# Patient Record
Sex: Female | Born: 1980
Health system: Southern US, Community
[De-identification: ages and names within clinical notes are randomized; demographics above are authoritative.]

## PROBLEM LIST (undated history)

## (undated) DIAGNOSIS — R112 Nausea with vomiting, unspecified: Secondary | ICD-10-CM

## (undated) DIAGNOSIS — Z9889 Other specified postprocedural states: Secondary | ICD-10-CM

## (undated) DIAGNOSIS — K219 Gastro-esophageal reflux disease without esophagitis: Secondary | ICD-10-CM

## (undated) DIAGNOSIS — H9190 Unspecified hearing loss, unspecified ear: Secondary | ICD-10-CM

## (undated) DIAGNOSIS — Z3480 Encounter for supervision of other normal pregnancy, unspecified trimester: Secondary | ICD-10-CM

## (undated) DIAGNOSIS — O99619 Diseases of the digestive system complicating pregnancy, unspecified trimester: Secondary | ICD-10-CM

## (undated) DIAGNOSIS — Z975 Presence of (intrauterine) contraceptive device: Secondary | ICD-10-CM

## (undated) DIAGNOSIS — Z98891 History of uterine scar from previous surgery: Secondary | ICD-10-CM

## (undated) HISTORY — DX: Presence of (intrauterine) contraceptive device: Z97.5

## (undated) HISTORY — PX: WISDOM TOOTH EXTRACTION: SHX21

---

## 2004-11-29 ENCOUNTER — Emergency Department: Payer: Self-pay | Admitting: Emergency Medicine

## 2008-01-19 ENCOUNTER — Emergency Department (HOSPITAL_COMMUNITY): Admission: EM | Admit: 2008-01-19 | Discharge: 2008-01-19 | Payer: Self-pay | Admitting: Emergency Medicine

## 2008-02-26 ENCOUNTER — Emergency Department: Payer: Self-pay | Admitting: Emergency Medicine

## 2008-02-26 ENCOUNTER — Other Ambulatory Visit: Payer: Self-pay

## 2008-02-26 ENCOUNTER — Ambulatory Visit: Payer: Self-pay

## 2008-02-27 ENCOUNTER — Inpatient Hospital Stay: Payer: Self-pay

## 2008-03-02 ENCOUNTER — Emergency Department: Payer: Self-pay | Admitting: Emergency Medicine

## 2009-05-09 ENCOUNTER — Emergency Department (HOSPITAL_COMMUNITY): Admission: EM | Admit: 2009-05-09 | Discharge: 2009-05-09 | Payer: Self-pay | Admitting: Emergency Medicine

## 2009-07-22 ENCOUNTER — Emergency Department (HOSPITAL_COMMUNITY): Admission: EM | Admit: 2009-07-22 | Discharge: 2009-07-22 | Payer: Self-pay | Admitting: Emergency Medicine

## 2012-03-22 ENCOUNTER — Inpatient Hospital Stay (HOSPITAL_COMMUNITY)
Admission: AD | Admit: 2012-03-22 | Discharge: 2012-03-22 | Disposition: A | Discharge status: Home / Self Care | Payer: Medicaid Other | Source: Ambulatory Visit | Attending: Obstetrics & Gynecology | Admitting: Obstetrics & Gynecology

## 2012-03-22 ENCOUNTER — Encounter (HOSPITAL_COMMUNITY): Payer: Self-pay

## 2012-03-22 DIAGNOSIS — Z3202 Encounter for pregnancy test, result negative: Secondary | ICD-10-CM

## 2012-03-22 HISTORY — DX: Unspecified hearing loss, unspecified ear: H91.90

## 2012-03-22 NOTE — MAU Note (Signed)
Deaf Interpreter line called back requested information will call us back when they know how long it will be before the deaf interpreter will be able to come.

## 2012-03-22 NOTE — MAU Note (Signed)
Via a sign Presenter, broadcasting, patient states that she is here to confirm if she is pregnant. She states that she is having pregnancy symptoms (nausea, sore breast). Her lmp 03/01/2012. She states that she is going to see her physician on Monday for a physical exam and plan to get an injection that she is not sure the name or its purpose. Presently, she denies any pain, vaginal bleeding or abnormal discharge. She states that she wants to concieve and had questions on when its time to get pregnant.

## 2012-03-22 NOTE — Progress Notes (Signed)
Interpreter will be here in about and pt aware

## 2012-03-22 NOTE — MAU Provider Note (Signed)
  History     CSN: [id number]  Arrival date and time: 03/22/12 1935   None   [name] y.o. No obstetric history on file.  Chief Complaint  Patient presents with  . Possible Pregnancy   HPI She was here today for a pregnancy test. She wants to become pregnant. She is deaf, and there seems to be some communication errors between her and the admissions desk; who thought she was here for a depo shot. She has no complaints and does not want to be examined in any form. This was communicated through the sign language translator.    Past Medical History  Diagnosis Date  . Deafness     Past Surgical History  Procedure Date  . Cesarean section     History reviewed. No pertinent family history.  History  Substance Use Topics  . Smoking status: Never Smoker   . Smokeless tobacco: Not on file  . Alcohol Use: No    Allergies: No Known Allergies  No prescriptions prior to admission    Review of Systems  All other systems reviewed and are negative.    Physical Exam   Blood pressure 119/76, pulse 72, temperature 98.2 F (36.8 C), temperature source Oral, resp. rate 20, height 4\' 11"  (1.499 m), weight 68.402 kg (150 lb 12.8 oz), last menstrual period 03/01/2012.  Physical Exam  Constitutional: She appears well-developed and well-nourished. No distress.       Patient is deaf. Does not desire a formal exam. Happy and pleasant. Apologizing for miscommunication.   Skin: She is not diaphoretic.  Psychiatric: She has a normal mood and affect.    MAU Course  Procedures 1. Pregnancy test  Assessment and Plan  1. Negative Pregnancy test 2. Discussion with translator about menstrual cycles, her particular cycle, ovulation, best time to have sexual intercourse to become pregnant, vitamins and AVS printed out on the same.  3. Advised to make pre-pregnancy appointment with OB/GYN. 4. Discharge home.  Felix Pacini 03/22/2012, 10:13 PM

## 2012-03-23 NOTE — MAU Provider Note (Signed)
Seen and agree [name] CNM 

## 2012-07-23 ENCOUNTER — Encounter: Payer: Self-pay | Admitting: Obstetrics & Gynecology

## 2012-07-23 ENCOUNTER — Ambulatory Visit (INDEPENDENT_AMBULATORY_CARE_PROVIDER_SITE_OTHER): Payer: Medicaid Other | Admitting: Obstetrics & Gynecology

## 2012-07-23 ENCOUNTER — Other Ambulatory Visit (HOSPITAL_COMMUNITY)
Admission: RE | Admit: 2012-07-23 | Discharge: 2012-07-23 | Disposition: A | Discharge status: Home / Self Care | Payer: Medicaid Other | Source: Ambulatory Visit | Attending: Obstetrics & Gynecology | Admitting: Obstetrics & Gynecology

## 2012-07-23 VITALS — BP 106/68 | Temp 98.3°F | Wt 162.2 lb

## 2012-07-23 DIAGNOSIS — Z3480 Encounter for supervision of other normal pregnancy, unspecified trimester: Secondary | ICD-10-CM

## 2012-07-23 DIAGNOSIS — Z3490 Encounter for supervision of normal pregnancy, unspecified, unspecified trimester: Secondary | ICD-10-CM

## 2012-07-23 DIAGNOSIS — Z01419 Encounter for gynecological examination (general) (routine) without abnormal findings: Secondary | ICD-10-CM | POA: Insufficient documentation

## 2012-07-23 DIAGNOSIS — Z113 Encounter for screening for infections with a predominantly sexual mode of transmission: Secondary | ICD-10-CM | POA: Insufficient documentation

## 2012-07-23 LAB — POCT URINALYSIS DIP (DEVICE)
Bilirubin Urine: NEGATIVE
Glucose, UA: NEGATIVE mg/dL
Leukocytes, UA: NEGATIVE
Protein, ur: NEGATIVE mg/dL

## 2012-07-23 NOTE — Patient Instructions (Signed)
Pregnancy - Second Trimester The second trimester of pregnancy (3 to 6 months) is a period of rapid growth for you and your baby. At the end of the sixth month, your baby is about 9 inches long and weighs 1 1/2 pounds. You will begin to feel the baby move between 18 and 20 weeks of the pregnancy. This is called quickening. Weight gain is faster. A clear fluid (colostrum) may leak out of your breasts. You may feel small contractions of the womb (uterus). This is known as false labor or Braxton-Hicks contractions. This is like a practice for labor when the baby is ready to be born. Usually, the problems with morning sickness have usually passed by the end of your first trimester. Some women develop small dark blotches (called cholasma, mask of pregnancy) on their face that usually goes away after the baby is born. Exposure to the sun makes the blotches worse. Acne may also develop in some pregnant women and pregnant women who have acne, may find that it goes away. PRENATAL EXAMS  Blood work may continue to be done during prenatal exams. These tests are done to check on your health and the probable health of your baby. Blood work is used to follow your blood levels (hemoglobin). Anemia (low hemoglobin) is common during pregnancy. Iron and vitamins are given to help prevent this. You will also be checked for diabetes between 24 and 28 weeks of the pregnancy. Some of the previous blood tests may be repeated.  The size of the uterus is measured during each visit. This is to make sure that the baby is continuing to grow properly according to the dates of the pregnancy.  Your blood pressure is checked every prenatal visit. This is to make sure you are not getting toxemia.  Your urine is checked to make sure you do not have an infection, diabetes or protein in the urine.  Your weight is checked often to make sure gains are happening at the suggested rate. This is to ensure that both you and your baby are growing  normally.  Sometimes, an ultrasound is performed to confirm the proper growth and development of the baby. This is a test which bounces harmless sound waves off the baby so your caregiver can more accurately determine due dates. Sometimes, a specialized test is done on the amniotic fluid surrounding the baby. This test is called an amniocentesis. The amniotic fluid is obtained by sticking a needle into the belly (abdomen). This is done to check the chromosomes in instances where there is a concern about possible genetic problems with the baby. It is also sometimes done near the end of pregnancy if an early delivery is required. In this case, it is done to help make sure the baby's lungs are mature enough for the baby to live outside of the womb. CHANGES OCCURING IN THE SECOND TRIMESTER OF PREGNANCY Your body goes through many changes during pregnancy. They vary from person to person. Talk to your caregiver about changes you notice that you are concerned about.  During the second trimester, you will likely have an increase in your appetite. It is normal to have cravings for certain foods. This varies from person to person and pregnancy to pregnancy.  Your lower abdomen will begin to bulge.  You may have to urinate more often because the uterus and baby are pressing on your bladder. It is also common to get more bladder infections during pregnancy (pain with urination). You can help this by   drinking lots of fluids and emptying your bladder before and after intercourse.  You may begin to get stretch marks on your hips, abdomen, and breasts. These are normal changes in the body during pregnancy. There are no exercises or medications to take that prevent this change.  You may begin to develop swollen and bulging veins (varicose veins) in your legs. Wearing support hose, elevating your feet for 15 minutes, 3 to 4 times a day and limiting salt in your diet helps lessen the problem.  Heartburn may develop  as the uterus grows and pushes up against the stomach. Antacids recommended by your caregiver helps with this problem. Also, eating smaller meals 4 to 5 times a day helps.  Constipation can be treated with a stool softener or adding bulk to your diet. Drinking lots of fluids, vegetables, fruits, and whole grains are helpful.  Exercising is also helpful. If you have been very active up until your pregnancy, most of these activities can be continued during your pregnancy. If you have been less active, it is helpful to start an exercise program such as walking.  Hemorrhoids (varicose veins in the rectum) may develop at the end of the second trimester. Warm sitz baths and hemorrhoid cream recommended by your caregiver helps hemorrhoid problems.  Backaches may develop during this time of your pregnancy. Avoid heavy lifting, wear low heal shoes and practice good posture to help with backache problems.  Some pregnant women develop tingling and numbness of their hand and fingers because of swelling and tightening of ligaments in the wrist (carpel tunnel syndrome). This goes away after the baby is born.  As your breasts enlarge, you may have to get a bigger bra. Get a comfortable, cotton, support bra. Do not get a nursing bra until the last month of the pregnancy if you will be nursing the baby.  You may get a dark line from your belly button to the pubic area called the linea nigra.  You may develop rosy cheeks because of increase blood flow to the face.  You may develop spider looking lines of the face, neck, arms and chest. These go away after the baby is born. HOME CARE INSTRUCTIONS   It is extremely important to avoid all smoking, herbs, alcohol, and unprescribed drugs during your pregnancy. These chemicals affect the formation and growth of the baby. Avoid these chemicals throughout the pregnancy to ensure the delivery of a healthy infant.  Most of your home care instructions are the same as  suggested for the first trimester of your pregnancy. Keep your caregiver's appointments. Follow your caregiver's instructions regarding medication use, exercise and diet.  During pregnancy, you are providing food for you and your baby. Continue to eat regular, well-balanced meals. Choose foods such as meat, fish, milk and other low fat dairy products, vegetables, fruits, and whole-grain breads and cereals. Your caregiver will tell you of the ideal weight gain.  A physical sexual relationship may be continued up until near the end of pregnancy if there are no other problems. Problems could include early (premature) leaking of amniotic fluid from the membranes, vaginal bleeding, abdominal pain, or other medical or pregnancy problems.  Exercise regularly if there are no restrictions. Check with your caregiver if you are unsure of the safety of some of your exercises. The greatest weight gain will occur in the last 2 trimesters of pregnancy. Exercise will help you:  Control your weight.  Get you in shape for labor and delivery.  Lose weight   after you have the baby.  Wear a good support or jogging bra for breast tenderness during pregnancy. This may help if worn during sleep. Pads or tissues may be used in the bra if you are leaking colostrum.  Do not use hot tubs, steam rooms or saunas throughout the pregnancy.  Wear your seat belt at all times when driving. This protects you and your baby if you are in an accident.  Avoid raw meat, uncooked cheese, cat litter boxes and soil used by cats. These carry germs that can cause birth defects in the baby.  The second trimester is also a good time to visit your dentist for your dental health if this has not been done yet. Getting your teeth cleaned is OK. Use a soft toothbrush. Brush gently during pregnancy.  It is easier to loose urine during pregnancy. Tightening up and strengthening the pelvic muscles will help with this problem. Practice stopping your  urination while you are going to the bathroom. These are the same muscles you need to strengthen. It is also the muscles you would use as if you were trying to stop from passing gas. You can practice tightening these muscles up 10 times a set and repeating this about 3 times per day. Once you know what muscles to tighten up, do not perform these exercises during urination. It is more likely to contribute to an infection by backing up the urine.  Ask for help if you have financial, counseling or nutritional needs during pregnancy. Your caregiver will be able to offer counseling for these needs as well as refer you for other special needs.  Your skin may become oily. If so, wash your face with mild soap, use non-greasy moisturizer and oil or cream based makeup. MEDICATIONS AND DRUG USE IN PREGNANCY  Take prenatal vitamins as directed. The vitamin should contain 1 milligram of folic acid. Keep all vitamins out of reach of children. Only a couple vitamins or tablets containing iron may be fatal to a baby or young child when ingested.  Avoid use of all medications, including herbs, over-the-counter medications, not prescribed or suggested by your caregiver. Only take over-the-counter or prescription medicines for pain, discomfort, or fever as directed by your caregiver. Do not use aspirin.  Let your caregiver also know about herbs you may be using.  Alcohol is related to a number of birth defects. This includes fetal alcohol syndrome. All alcohol, in any form, should be avoided completely. Smoking will cause low birth rate and premature babies.  Street or illegal drugs are very harmful to the baby. They are absolutely forbidden. A baby born to an addicted mother will be addicted at birth. The baby will go through the same withdrawal an adult does. SEEK MEDICAL CARE IF:  You have any concerns or worries during your pregnancy. It is better to call with your questions if you feel they cannot wait, rather  than worry about them. SEEK IMMEDIATE MEDICAL CARE IF:   An unexplained oral temperature above 102 F (38.9 C) develops, or as your caregiver suggests.  You have leaking of fluid from the vagina (birth canal). If leaking membranes are suspected, take your temperature and tell your caregiver of this when you call.  There is vaginal spotting, bleeding, or passing clots. Tell your caregiver of the amount and how many pads are used. Light spotting in pregnancy is common, especially following intercourse.  You develop a bad smelling vaginal discharge with a change in the color from clear   to white.  You continue to feel sick to your stomach (nauseated) and have no relief from remedies suggested. You vomit blood or coffee ground-like materials.  You lose more than 2 pounds of weight or gain more than 2 pounds of weight over 1 week, or as suggested by your caregiver.  You notice swelling of your face, hands, feet, or legs.  You get exposed to German measles and have never had them.  You are exposed to fifth disease or chickenpox.  You develop belly (abdominal) pain. Round ligament discomfort is a common non-cancerous (benign) cause of abdominal pain in pregnancy. Your caregiver still must evaluate you.  You develop a bad headache that does not go away.  You develop fever, diarrhea, pain with urination, or shortness of breath.  You develop visual problems, blurry, or double vision.  You fall or are in a car accident or any kind of trauma.  There is mental or physical violence at home. Document Released: 05/30/2001 Document Revised: 08/28/2011 Document Reviewed: 12/02/2008 Exit[employer] Patient Information 2013 [employer].  

## 2012-07-26 NOTE — Progress Notes (Signed)
  Subjective:    [name] is being seen today for her first obstetrical visit.  This is not a planned pregnancy. She is at [redacted]w[redacted]d gestation. Her obstetrical history is significant for n/a. Relationship with FOB: spouse, living together. Patient does intend to breast feed. Pregnancy history fully reviewed.  Of note, both pt and FOB have hearing loss.  Per pt neither is congenital and their son is not hearing impaired. Past Medical History  Diagnosis Date  . Deafness    Past Surgical History  Procedure Date  . Cesarean section    No current outpatient prescriptions on file prior to visit.  No Known Allergies History   Social History  . Marital Status: Married    Spouse Name: N/A    Number of Children: N/A  . Years of Education: N/A   Occupational History  . Not on file.   Social History Main Topics  . Smoking status: Never Smoker   . Smokeless tobacco: Not on file  . Alcohol Use: No  . Drug Use: No  . Sexually Active: Yes    Birth Control/ Protection: None   Other Topics Concern  . Not on file   Social History Narrative  . No narrative on file   Family History  Problem Relation Age of Onset  . Cancer Mother      hPatient reports no complaints.  Review of Systems:   Review of Systems  Objective:     BP 106/68  Temp 98.3 F (36.8 C)  Wt 162 lb 3.2 oz (73.573 kg)  LMP 04/01/2012 Physical Exam  Exam Lungs: CTA CV: RRR Abd: gravid, +FHR- 150's GU: EGBUS: no lesions Vagina: no blood in vault Cervix: no lesion; no mucopurulent d/c Uterus: 16-18 weeks sized Adnexa: no masses; sl tender Ext: no edema        Assessment:  G2P1001 at 16 weeks- initial PNV No curretn problems    Plan:     Initial labs drawn. Prenatal vitamins. Problem list reviewed and updated. AFP3 discussed: requested. Role of ultrasound in pregnancy discussed; fetal survey: requested. Amniocentesis discussed: not indicated. Follow up in 4 weeks. 60% of 45 min visit  spent on counseling and coordination of care.  At the end of the visit, pt mentioned that she has already started [facility] at another location and already had an initial OB visit and has ALREADY been scheduled for an [place] Pt will f/u with her other OB provider for the remainder of her care.    HARRAWAY-SMITH, Tomoya Ringwald 07/26/2012

## 2012-08-28 ENCOUNTER — Emergency Department (HOSPITAL_COMMUNITY): Payer: Medicaid Other

## 2012-08-28 ENCOUNTER — Emergency Department (HOSPITAL_COMMUNITY)
Admission: EM | Admit: 2012-08-28 | Discharge: 2012-08-28 | Disposition: A | Discharge status: Home / Self Care | Payer: Medicaid Other | Attending: Emergency Medicine | Admitting: Emergency Medicine

## 2012-08-28 DIAGNOSIS — R059 Cough, unspecified: Secondary | ICD-10-CM | POA: Insufficient documentation

## 2012-08-28 DIAGNOSIS — Z9889 Other specified postprocedural states: Secondary | ICD-10-CM | POA: Insufficient documentation

## 2012-08-28 DIAGNOSIS — M7918 Myalgia, other site: Secondary | ICD-10-CM

## 2012-08-28 DIAGNOSIS — H9190 Unspecified hearing loss, unspecified ear: Secondary | ICD-10-CM | POA: Insufficient documentation

## 2012-08-28 DIAGNOSIS — R509 Fever, unspecified: Secondary | ICD-10-CM | POA: Insufficient documentation

## 2012-08-28 DIAGNOSIS — R071 Chest pain on breathing: Secondary | ICD-10-CM | POA: Insufficient documentation

## 2012-08-28 DIAGNOSIS — IMO0001 Reserved for inherently not codable concepts without codable children: Secondary | ICD-10-CM | POA: Insufficient documentation

## 2012-08-28 DIAGNOSIS — M542 Cervicalgia: Secondary | ICD-10-CM | POA: Insufficient documentation

## 2012-08-28 DIAGNOSIS — R0602 Shortness of breath: Secondary | ICD-10-CM | POA: Insufficient documentation

## 2012-08-28 DIAGNOSIS — M25519 Pain in unspecified shoulder: Secondary | ICD-10-CM | POA: Insufficient documentation

## 2012-08-28 DIAGNOSIS — O9989 Other specified diseases and conditions complicating pregnancy, childbirth and the puerperium: Secondary | ICD-10-CM | POA: Insufficient documentation

## 2012-08-28 DIAGNOSIS — R05 Cough: Secondary | ICD-10-CM | POA: Insufficient documentation

## 2012-08-28 NOTE — ED Provider Notes (Signed)
History    This chart was scribed for [name], a non-physician practitioner working with Dominique Razor, [name] by [name], ED Scribe. This patient was seen in room WTR9/WTR9 and the patient's care was started at 1549.     CSN: [id number]  Arrival date & time 08/28/12  1522   None     Chief Complaint  Patient presents with  . Neck Pain  . Shoulder Pain     Patient is a 32 y.o. female presenting with neck pain. The history is provided by the patient. History limited by: deafness. Language interpreter used: used pen and paper to communicate.  Neck Pain Pain location:  R side Quality: "muscle strain" Pain radiates to:  R shoulder Pain severity:  Mild Pain is:  Same all the time Onset quality:  Unable to specify Duration:  5 days Timing:  Constant Progression:  Unchanged Chronicity:  New Context: lifting a heavy object (her child)   Context: not fall, not jumping from heights and not recent injury   Relieved by:  None tried Worsened by:  Nothing tried Ineffective treatments:  None tried Associated symptoms: fever (subjective)   Associated symptoms: no numbness, no tingling and no weakness    [name] is a 32 y.o. female who presents to the Emergency Department complaining of constant right-sided neck pain, right-sided shoulder pain, and right-sided pleuritic chest pain onset 5 days. Pt reports shortness of breath, fever (subjective), and cough for several days. Pt states pain is 4/10 in severity. Pt states she is [redacted] weeks pregnant and taking OTC prenatal vitamins  LMP: April 01, 2012  PMHx significant for deafness. Communicated through written word.   Past Medical History  Diagnosis Date  . Deafness     Past Surgical History  Procedure Laterality Date  . Cesarean section      Family History  Problem Relation Age of Onset  . Cancer Mother     History  Substance Use Topics  . Smoking status: Never Smoker   . Smokeless tobacco: Not on file   . Alcohol Use: No    OB History   Grav Para Term Preterm Abortions TAB SAB Ect Mult Living   2 1 1       1       Review of Systems  Constitutional: Positive for fever (subjective).  HENT: Positive for neck pain.   Respiratory: Positive for cough.   Gastrointestinal: Negative for nausea, vomiting, abdominal pain and diarrhea.  Musculoskeletal: Positive for myalgias.  Skin: Negative.   Allergic/Immunologic: Negative for immunocompromised state.  Neurological: Negative for tingling, weakness and numbness.  All other systems reviewed and are negative.    Allergies  Review of patient's allergies indicates no known allergies.  Home Medications   Current Outpatient Rx  Name  Route  Sig  Dispense  Refill  . Prenatal Vit-Fe Fumarate-FA (PRENATAL MULTIVITAMIN) TABS   Oral   Take 1 tablet by mouth daily.           BP 107/59  Pulse 88  Temp(Src) 99 F (37.2 C) (Oral)  SpO2 97%  LMP 04/01/2012  Physical Exam  Nursing note and vitals reviewed. Constitutional: She is oriented to person, place, and time. She appears well-developed and well-nourished. No distress.  HENT:  Head: Normocephalic and atraumatic.  Eyes: Conjunctivae and EOM are normal.  Neck: Normal range of motion. Neck supple.  No meningeal signs  Cardiovascular: Normal rate, regular rhythm and normal heart sounds.  Exam reveals no gallop  and no friction rub.   No murmur heard. Pulmonary/Chest: Effort normal and breath sounds normal. No respiratory distress. She has no wheezes. She has no rales. She exhibits no tenderness.  Abdominal: Soft. Bowel sounds are normal. She exhibits no distension. There is no tenderness. There is no rebound and no guarding.  Musculoskeletal: Normal range of motion. She exhibits no edema and no tenderness.  Good strength and ROM throughout. No crepitus, step offs, or bony tenderness.   Neurological: She is alert and oriented to person, place, and time. No cranial nerve deficit.  No  focal deficits.   Skin: Skin is warm and dry. She is not diaphoretic. No erythema.  Psychiatric: She has a normal mood and affect. Her behavior is normal.    ED Course  Procedures (including critical care time) Medications - No data to display  4:10 PM Consulted with Dr. Juleen China about pt.  Labs Reviewed - No data to display No results found. No results found. Dg Chest 2 View  08/28/2012  *RADIOLOGY REPORT*  Clinical Data: Pleuritic chest pain.  CHEST - 2 VIEW  Comparison: Two-view thoracic spine 07/22/2009.  Findings: Lung volumes are low, exaggerating the heart size.  Mild diffuse hazy interstitial prominence is evident.  There is some bronchitic change at the bases bilaterally.  No significant airspace consolidation is present.  Minimal atelectasis is present bilaterally.  IMPRESSION:  1.  Mild infra hilar bronchitic change bilaterally.  This could be associated with an acute viral process. 2.  Mild diffuse hazy interstitial prominence.  Question mild edema. 3.  Low lung volumes and bibasilar atelectasis.   Original Report Authenticated By: Marin Roberts, M.D.     Diagnosis: musculoskeletal pain, upper respiratory infection.   MDM  PE shows no instability, tenderness, or deformity of acromioclavicular and sternoclavicular joints, the cervical spine, glenohumeral joint, coracoid process, acromion, or scapula. Good shoulder strength, good ROM, no signs of impingement or instability.   Pt denies a history of travel, immobilization, surgery, fevers, cancer, oral contraceptives or hormone use, swelling of the legs. The patient has no history of venous thromboembolism.Pt CXR negative for acute infiltrate. Patients symptoms are consistent with URI, likely viral etiology. Discussed that antibiotics are not indicated for viral infections. Pt will be discharged with symptomatic treatment.  Verbalizes understanding and is agreeable with plan. Pt is hemodynamically stable & in NAD prior to dc.  Discussed pt case with Dr. Juleen China who saw the pt as well and agrees with the plan.  I personally performed the services described in this documentation, which was scribed in my presence. The recorded information has been reviewed and is accurate.     Glade Nurse, PA-C 08/30/12 [id number]

## 2012-08-28 NOTE — ED Notes (Signed)
Pt states she has R side neck and R shoulder pain. Pt states she may have pulled a muscle in that area. Pt also reports she is pregnant and wants medications that are safe for pregnancy. Pt is deaf and writes down her complaints on a piece of paper. Pt arrives with companion.

## 2012-08-31 NOTE — ED Provider Notes (Signed)
Medical screening examination/treatment/procedure(s) were performed by non-physician practitioner and as supervising physician I was immediately available for consultation/collaboration.  Flint Melter, MD 08/31/12 [id number]

## 2012-09-19 LAB — OB RESULTS CONSOLE GC/CHLAMYDIA
Chlamydia: NEGATIVE
Gonorrhea: NEGATIVE

## 2012-09-19 LAB — OB RESULTS CONSOLE HEPATITIS B SURFACE ANTIGEN: Hepatitis B Surface Ag: NEGATIVE

## 2012-09-19 LAB — OB RESULTS CONSOLE RUBELLA ANTIBODY, IGM: Rubella: IMMUNE

## 2012-09-19 LAB — OB RESULTS CONSOLE ANTIBODY SCREEN: Antibody Screen: NEGATIVE

## 2012-09-19 LAB — OB RESULTS CONSOLE HIV ANTIBODY (ROUTINE TESTING): HIV: NONREACTIVE

## 2013-01-09 ENCOUNTER — Encounter (HOSPITAL_COMMUNITY): Payer: Self-pay

## 2013-01-10 ENCOUNTER — Encounter (HOSPITAL_COMMUNITY)
Admission: RE | Admit: 2013-01-10 | Discharge: 2013-01-10 | Disposition: A | Discharge status: Home / Self Care | Payer: Medicaid Other | Source: Ambulatory Visit | Attending: Obstetrics and Gynecology | Admitting: Obstetrics and Gynecology

## 2013-01-10 ENCOUNTER — Encounter (HOSPITAL_COMMUNITY): Payer: Self-pay | Admitting: Pharmacy Technician

## 2013-01-10 ENCOUNTER — Encounter (HOSPITAL_COMMUNITY): Payer: Self-pay

## 2013-01-10 DIAGNOSIS — Z01812 Encounter for preprocedural laboratory examination: Secondary | ICD-10-CM | POA: Insufficient documentation

## 2013-01-10 DIAGNOSIS — Z01818 Encounter for other preprocedural examination: Secondary | ICD-10-CM | POA: Insufficient documentation

## 2013-01-10 HISTORY — DX: Diseases of the digestive system complicating pregnancy, unspecified trimester: O99.619

## 2013-01-10 HISTORY — DX: Gastro-esophageal reflux disease without esophagitis: K21.9

## 2013-01-10 HISTORY — DX: Other specified postprocedural states: R11.2

## 2013-01-10 HISTORY — DX: Nausea with vomiting, unspecified: Z98.890

## 2013-01-10 LAB — ABO/RH: ABO/RH(D): O POS

## 2013-01-10 LAB — BASIC METABOLIC PANEL
BUN: 6 mg/dL (ref 6–23)
CO2: 22 mEq/L (ref 19–32)
Calcium: 9.2 mg/dL (ref 8.4–10.5)
Chloride: 100 mEq/L (ref 96–112)
Creatinine, Ser: 0.5 mg/dL (ref 0.50–1.10)

## 2013-01-10 LAB — CBC
HCT: 35.4 % — ABNORMAL LOW (ref 36.0–46.0)
Hemoglobin: 11.9 g/dL — ABNORMAL LOW (ref 12.0–15.0)
MCV: 93.9 fL (ref 78.0–100.0)
RBC: 3.77 MIL/uL — ABNORMAL LOW (ref 3.87–5.11)
RDW: 14.4 % (ref 11.5–15.5)
WBC: 8.1 10*3/uL (ref 4.0–10.5)

## 2013-01-10 LAB — TYPE AND SCREEN: Antibody Screen: NEGATIVE

## 2013-01-10 NOTE — Pre-Procedure Instructions (Signed)
Hospital will need to use [facility] for patient during stay.  Patient is daef.  We have permission to call [phone] per [place] at [facility] when needed.

## 2013-01-10 NOTE — Patient Instructions (Addendum)
   Your procedure is scheduled on:  Monday, July 28  Enter through the Main Entrance of [facility] at:  730 am Pick up the phone at the desk and dial [phone] and inform us of your arrival.  Please call this number if you have any problems the morning of surgery: [phone]  Remember: Do not eat or drink after midnight: Sunday Take these medicines the morning of surgery with a SIP OF WATER:  Do not wear jewelry, make-up, or FINGER nail polish No metal in your hair or on your body. Do not wear lotions, powders, perfumes. You may wear deodorant.  Please use your CHG wash as directed prior to surgery.  Do not shave anywhere for at least 12 hours prior to first CHG shower.  Do not bring valuables to the hospital. Contacts, dentures or bridgework may not be worn into surgery.  Leave suitcase in the car. After Surgery it may be brought to your room. For patients being admitted to the hospital, checkout time is 11:00am the day of discharge.  Home with husband Kanell.

## 2013-01-10 NOTE — Pre-Procedure Instructions (Signed)
Spoke with Asher Muir at [employer].  Need Deaf Translator for PAT appt and Day of Surgery 01/13/13.  This had not been arranged when her C/S PAT appt. Was scheduled.  Spoke with Lawanna at [phone] to arrange for Tresa Endo to be here this afternoon at 230pm for PAT appt and also schedule Tresa Endo to be here day of surgery 01/13/13.  Jamie at [facility] off[facility] is aware patient has OB appt at 330pm today and patient may run late.

## 2013-01-12 ENCOUNTER — Encounter (HOSPITAL_COMMUNITY): Payer: Self-pay | Admitting: Obstetrics and Gynecology

## 2013-01-12 DIAGNOSIS — H9193 Unspecified hearing loss, bilateral: Secondary | ICD-10-CM

## 2013-01-12 DIAGNOSIS — Z3480 Encounter for supervision of other normal pregnancy, unspecified trimester: Secondary | ICD-10-CM

## 2013-01-12 HISTORY — DX: Encounter for supervision of other normal pregnancy, unspecified trimester: Z34.80

## 2013-01-12 NOTE — H&P (Signed)
[name] is a 32 y.o. female G2P1001 at 58 for rLTCS, pt desired VBAC, but didn't labor and cervic not dilated.  Prenatal care largely uncomplicated.  Maternal B deafness - had interpreter to help with care.  +FM, no LOF, no VB, occ ctx.   Maternal Medical History:  Contractions: Frequency: irregular.    Fetal activity: Perceived fetal activity is normal.    Prenatal complications: no prenatal complications Prenatal Complications - Diabetes: none.    OB History   Grav Para Term Preterm Abortions TAB SAB Ect Mult Living   2 1 1       1      G1 - breech - LTCS 7#13 female  Past Medical History  Diagnosis Date  . Deafness   . PONV (postoperative nausea and vomiting)   . Gastroesophageal reflux in pregancy   . 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 Cesarean section    . Wisdom tooth extraction     Family History: family history includes Cancer in her mother.m aunt, paunt Social History:  reports that she has never smoked. She has never used smokeless tobacco. She reports that she does not drink alcohol or use illicit drugs.  Meds PNV All NKDA   Prenatal Transfer Tool  Maternal Diabetes: No Genetic Screening: Normal Maternal Ultrasounds/Referrals: Normal Fetal Ultrasounds or other Referrals:  None Maternal Substance Abuse:  No Significant Maternal Medications:  None Significant Maternal Lab Results:  Lab values include: Group B Strep positive Other Comments:  Maternal and Paternal deafness - uses sign language  Review of Systems  Constitutional: Negative.   HENT: Negative.   Eyes: Negative.   Respiratory: Negative.   Cardiovascular: Negative.   Gastrointestinal: Negative.   Genitourinary: Negative.   Musculoskeletal: Negative.   Skin: Negative.   Neurological: Negative.   Psychiatric/Behavioral: Negative.       Last menstrual period 04/01/2012. Maternal Exam:   Abdomen: Surgical scars: low transverse.   Fundal height is appropriate for gestation.   Estimated fetal weight is 7-8#.   Fetal presentation: vertex  Introitus: Normal vulva. Normal vagina.  Cervix: Cervix evaluated by digital exam.     Physical Exam  Constitutional: She is oriented to person, place, and time. She appears well-developed and well-nourished.  HENT:  Head: Normocephalic and atraumatic.  Cardiovascular: Normal rate and regular rhythm.   Respiratory: Effort normal and breath sounds normal. No respiratory distress.  GI: Soft. Bowel sounds are normal. There is no tenderness.  Musculoskeletal: Normal range of motion.  Neurological: She is alert and oriented to person, place, and time.  Skin: Skin is warm and dry.  Psychiatric: She has a normal mood and affect. Her behavior is normal.    Prenatal labs: ABO, Rh: --/--/O POS, O POS (07/25 1540) Antibody: NEG (07/25 1540) Rubella: Immune (04/03 0000) RPR: NON REACTIVE (07/25 1540)  HBsAg: Negative (04/03 0000)  HIV: Non-reactive (04/03 0000)  GBS:   positive  Tdap 4/24  glucola 113/Hgb 12.6/ Plt 281K/ Varicella immune/ Hgb electro WNL// AFP WNL/ GC neg/ Chl neg  8wk [place] cwd  Anat [place], cwd, nl anat, post plac, female  Assessment/Plan: 32yo G2P1001 at 38 for rLTCS D/W pt r/b/a of LTCS.  Will proceed.  Ancef for prophylaxis   BOVARD,Jymir Dunaj 01/12/2013, 11:55 PM

## 2013-01-13 ENCOUNTER — Encounter (HOSPITAL_COMMUNITY): Payer: Self-pay | Admitting: *Deleted

## 2013-01-13 ENCOUNTER — Encounter (HOSPITAL_COMMUNITY)
Admission: RE | Disposition: A | Discharge status: Home / Self Care | Payer: Self-pay | Source: Ambulatory Visit | Attending: Obstetrics and Gynecology

## 2013-01-13 ENCOUNTER — Inpatient Hospital Stay (HOSPITAL_COMMUNITY)
Admission: RE | Admit: 2013-01-13 | Discharge: 2013-01-16 | DRG: 766 | Disposition: A | Discharge status: Home / Self Care | Payer: Medicaid Other | Source: Ambulatory Visit | Attending: Obstetrics and Gynecology | Admitting: Obstetrics and Gynecology

## 2013-01-13 ENCOUNTER — Inpatient Hospital Stay (HOSPITAL_COMMUNITY): Payer: Medicaid Other | Admitting: Anesthesiology

## 2013-01-13 ENCOUNTER — Encounter (HOSPITAL_COMMUNITY): Payer: Self-pay | Admitting: Anesthesiology

## 2013-01-13 DIAGNOSIS — Z22330 Carrier of Group B streptococcus: Secondary | ICD-10-CM

## 2013-01-13 DIAGNOSIS — Z98891 History of uterine scar from previous surgery: Secondary | ICD-10-CM

## 2013-01-13 DIAGNOSIS — Z3490 Encounter for supervision of normal pregnancy, unspecified, unspecified trimester: Secondary | ICD-10-CM

## 2013-01-13 DIAGNOSIS — Z3480 Encounter for supervision of other normal pregnancy, unspecified trimester: Secondary | ICD-10-CM

## 2013-01-13 DIAGNOSIS — O34219 Maternal care for unspecified type scar from previous cesarean delivery: Principal | ICD-10-CM | POA: Diagnosis present

## 2013-01-13 DIAGNOSIS — O99892 Other specified diseases and conditions complicating childbirth: Secondary | ICD-10-CM | POA: Diagnosis present

## 2013-01-13 DIAGNOSIS — H9193 Unspecified hearing loss, bilateral: Secondary | ICD-10-CM

## 2013-01-13 HISTORY — DX: History of uterine scar from previous surgery: Z98.891

## 2013-01-13 HISTORY — DX: Encounter for supervision of other normal pregnancy, unspecified trimester: Z34.80

## 2013-01-13 SURGERY — Surgical Case
Anesthesia: Spinal | Site: Abdomen | Wound class: Clean Contaminated

## 2013-01-13 MED ORDER — ONDANSETRON HCL 4 MG PO TABS: 4.0000 mg | ORAL_TABLET | ORAL | Status: DC | PRN

## 2013-01-13 MED ORDER — DIPHENHYDRAMINE HCL 25 MG PO CAPS: 25.0000 mg | ORAL_CAPSULE | ORAL | Status: DC | PRN

## 2013-01-13 MED ORDER — METOCLOPRAMIDE HCL 5 MG/ML IJ SOLN
INTRAMUSCULAR | Status: AC
  Administered 2013-01-13: 10 mg via INTRAVENOUS
  Filled 2013-01-13: qty 2

## 2013-01-13 MED ORDER — DIPHENHYDRAMINE HCL 50 MG/ML IJ SOLN: 25.0000 mg | INTRAMUSCULAR | Status: DC | PRN

## 2013-01-13 MED ORDER — DIBUCAINE 1 % RE OINT: 1.0000 "application " | TOPICAL_OINTMENT | RECTAL | Status: DC | PRN

## 2013-01-13 MED ORDER — FENTANYL CITRATE 0.05 MG/ML IJ SOLN: 25.0000 ug | INTRAMUSCULAR | Status: DC | PRN

## 2013-01-13 MED ORDER — SODIUM CHLORIDE 0.9 % IJ SOLN: 3.0000 mL | INTRAMUSCULAR | Status: DC | PRN

## 2013-01-13 MED ORDER — KETOROLAC TROMETHAMINE 30 MG/ML IJ SOLN: 30.0000 mg | Freq: Four times a day (QID) | INTRAMUSCULAR | Status: AC | PRN

## 2013-01-13 MED ORDER — MEPERIDINE HCL 25 MG/ML IJ SOLN: 6.2500 mg | INTRAMUSCULAR | Status: DC | PRN

## 2013-01-13 MED ORDER — METOCLOPRAMIDE HCL 5 MG/ML IJ SOLN: 10.0000 mg | Freq: Once | INTRAMUSCULAR | Status: AC | PRN

## 2013-01-13 MED ORDER — CEFAZOLIN SODIUM-DEXTROSE 2-3 GM-% IV SOLR
2.0000 g | INTRAVENOUS | Status: AC
  Administered 2013-01-13: 2 g via INTRAVENOUS

## 2013-01-13 MED ORDER — ONDANSETRON HCL 4 MG/2ML IJ SOLN
INTRAMUSCULAR | Status: AC
  Filled 2013-01-13: qty 2

## 2013-01-13 MED ORDER — OXYCODONE-ACETAMINOPHEN 5-325 MG PO TABS
1.0000 | ORAL_TABLET | ORAL | Status: DC | PRN
  Administered 2013-01-14: 1 via ORAL
  Administered 2013-01-14: 2 via ORAL
  Administered 2013-01-14 – 2013-01-16 (×8): 1 via ORAL
  Filled 2013-01-13 (×3): qty 1
  Filled 2013-01-13: qty 2
  Filled 2013-01-13 (×3): qty 1
  Filled 2013-01-13: qty 2
  Filled 2013-01-13 (×2): qty 1

## 2013-01-13 MED ORDER — PHENYLEPHRINE HCL 10 MG/ML IJ SOLN
INTRAMUSCULAR | Status: DC | PRN
  Administered 2013-01-13: 80 ug via INTRAVENOUS
  Administered 2013-01-13 (×2): 40 ug via INTRAVENOUS
  Administered 2013-01-13 (×3): 80 ug via INTRAVENOUS
  Administered 2013-01-13: 40 ug via INTRAVENOUS

## 2013-01-13 MED ORDER — SCOPOLAMINE 1 MG/3DAYS TD PT72
MEDICATED_PATCH | TRANSDERMAL | Status: AC
  Administered 2013-01-13: 1.5 mg via TRANSDERMAL
  Filled 2013-01-13: qty 1

## 2013-01-13 MED ORDER — OXYTOCIN 40 UNITS IN LACTATED RINGERS INFUSION - SIMPLE MED: 62.5000 mL/h | INTRAVENOUS | Status: AC

## 2013-01-13 MED ORDER — FENTANYL CITRATE 0.05 MG/ML IJ SOLN
INTRAMUSCULAR | Status: DC | PRN
  Administered 2013-01-13: 25 ug via INTRATHECAL

## 2013-01-13 MED ORDER — SENNOSIDES-DOCUSATE SODIUM 8.6-50 MG PO TABS
2.0000 | ORAL_TABLET | Freq: Every day | ORAL | Status: DC
  Administered 2013-01-13 – 2013-01-15 (×3): 2 via ORAL

## 2013-01-13 MED ORDER — SCOPOLAMINE 1 MG/3DAYS TD PT72: 1.0000 | MEDICATED_PATCH | Freq: Once | TRANSDERMAL | Status: DC

## 2013-01-13 MED ORDER — ONDANSETRON HCL 4 MG/2ML IJ SOLN
INTRAMUSCULAR | Status: DC | PRN
  Administered 2013-01-13: 4 mg via INTRAVENOUS

## 2013-01-13 MED ORDER — 0.9 % SODIUM CHLORIDE (POUR BTL) OPTIME
TOPICAL | Status: DC | PRN
  Administered 2013-01-13: 1000 mL

## 2013-01-13 MED ORDER — LACTATED RINGERS IV SOLN: INTRAVENOUS | Status: DC

## 2013-01-13 MED ORDER — CEFAZOLIN SODIUM-DEXTROSE 2-3 GM-% IV SOLR
INTRAVENOUS | Status: AC
  Filled 2013-01-13: qty 50

## 2013-01-13 MED ORDER — NALOXONE HCL 1 MG/ML IJ SOLN: 1.0000 ug/kg/h | INTRAVENOUS | Status: DC | PRN

## 2013-01-13 MED ORDER — MORPHINE SULFATE 0.5 MG/ML IJ SOLN
INTRAMUSCULAR | Status: AC
  Filled 2013-01-13: qty 10

## 2013-01-13 MED ORDER — ZOLPIDEM TARTRATE 5 MG PO TABS: 5.0000 mg | ORAL_TABLET | Freq: Every evening | ORAL | Status: DC | PRN

## 2013-01-13 MED ORDER — IBUPROFEN 800 MG PO TABS
800.0000 mg | ORAL_TABLET | Freq: Three times a day (TID) | ORAL | Status: DC
  Administered 2013-01-13 – 2013-01-16 (×8): 800 mg via ORAL
  Filled 2013-01-13 (×9): qty 1

## 2013-01-13 MED ORDER — ONDANSETRON HCL 4 MG/2ML IJ SOLN: 4.0000 mg | Freq: Three times a day (TID) | INTRAMUSCULAR | Status: DC | PRN

## 2013-01-13 MED ORDER — NALOXONE HCL 0.4 MG/ML IJ SOLN: 0.4000 mg | INTRAMUSCULAR | Status: DC | PRN

## 2013-01-13 MED ORDER — OXYTOCIN 10 UNIT/ML IJ SOLN
INTRAMUSCULAR | Status: AC
  Filled 2013-01-13: qty 4

## 2013-01-13 MED ORDER — FENTANYL CITRATE 0.05 MG/ML IJ SOLN
INTRAMUSCULAR | Status: AC
  Filled 2013-01-13: qty 2

## 2013-01-13 MED ORDER — PHENYLEPHRINE 40 MCG/ML (10ML) SYRINGE FOR IV PUSH (FOR BLOOD PRESSURE SUPPORT)
PREFILLED_SYRINGE | INTRAVENOUS | Status: AC
  Filled 2013-01-13: qty 15

## 2013-01-13 MED ORDER — METOCLOPRAMIDE HCL 5 MG/ML IJ SOLN: 10.0000 mg | Freq: Three times a day (TID) | INTRAMUSCULAR | Status: DC | PRN

## 2013-01-13 MED ORDER — WITCH HAZEL-GLYCERIN EX PADS: 1.0000 "application " | MEDICATED_PAD | CUTANEOUS | Status: DC | PRN

## 2013-01-13 MED ORDER — SIMETHICONE 80 MG PO CHEW
80.0000 mg | CHEWABLE_TABLET | Freq: Three times a day (TID) | ORAL | Status: DC
  Administered 2013-01-13 – 2013-01-16 (×10): 80 mg via ORAL

## 2013-01-13 MED ORDER — LANOLIN HYDROUS EX OINT: 1.0000 "application " | TOPICAL_OINTMENT | CUTANEOUS | Status: DC | PRN

## 2013-01-13 MED ORDER — BUPIVACAINE IN DEXTROSE 0.75-8.25 % IT SOLN
INTRATHECAL | Status: DC | PRN
  Administered 2013-01-13: 1.3 mL via INTRATHECAL

## 2013-01-13 MED ORDER — KETOROLAC TROMETHAMINE 30 MG/ML IJ SOLN
INTRAMUSCULAR | Status: AC
  Administered 2013-01-13: 30 mg via INTRAMUSCULAR
  Filled 2013-01-13: qty 1

## 2013-01-13 MED ORDER — SIMETHICONE 80 MG PO CHEW: 80.0000 mg | CHEWABLE_TABLET | ORAL | Status: DC | PRN

## 2013-01-13 MED ORDER — DIPHENHYDRAMINE HCL 25 MG PO CAPS: 25.0000 mg | ORAL_CAPSULE | Freq: Four times a day (QID) | ORAL | Status: DC | PRN

## 2013-01-13 MED ORDER — DIPHENHYDRAMINE HCL 50 MG/ML IJ SOLN: 12.5000 mg | INTRAMUSCULAR | Status: DC | PRN

## 2013-01-13 MED ORDER — CALCIUM CARBONATE ANTACID 500 MG PO CHEW: 1.0000 | CHEWABLE_TABLET | Freq: Every evening | ORAL | Status: DC | PRN

## 2013-01-13 MED ORDER — MENTHOL 3 MG MT LOZG: 1.0000 | LOZENGE | OROMUCOSAL | Status: DC | PRN

## 2013-01-13 MED ORDER — SCOPOLAMINE 1 MG/3DAYS TD PT72
1.0000 | MEDICATED_PATCH | Freq: Once | TRANSDERMAL | Status: DC
  Filled 2013-01-13: qty 1

## 2013-01-13 MED ORDER — ONDANSETRON HCL 4 MG/2ML IJ SOLN: 4.0000 mg | INTRAMUSCULAR | Status: DC | PRN

## 2013-01-13 MED ORDER — MORPHINE SULFATE (PF) 0.5 MG/ML IJ SOLN
INTRAMUSCULAR | Status: DC | PRN
  Administered 2013-01-13: .15 mg via INTRATHECAL

## 2013-01-13 MED ORDER — PHENYLEPHRINE 40 MCG/ML (10ML) SYRINGE FOR IV PUSH (FOR BLOOD PRESSURE SUPPORT)
PREFILLED_SYRINGE | INTRAVENOUS | Status: AC
  Filled 2013-01-13: qty 5

## 2013-01-13 MED ORDER — NALBUPHINE HCL 10 MG/ML IJ SOLN
5.0000 mg | INTRAMUSCULAR | Status: DC | PRN
Start: 2013-01-13 — End: 2013-01-16
  Filled 2013-01-13: qty 1

## 2013-01-13 MED ORDER — PRENATAL MULTIVITAMIN CH
1.0000 | ORAL_TABLET | Freq: Every day | ORAL | Status: DC
  Administered 2013-01-14 – 2013-01-16 (×3): 1 via ORAL
  Filled 2013-01-13 (×4): qty 1

## 2013-01-13 MED ORDER — LACTATED RINGERS IV SOLN
INTRAVENOUS | Status: DC
  Administered 2013-01-13 (×3): via INTRAVENOUS

## 2013-01-13 MED ORDER — LACTATED RINGERS IV SOLN
Freq: Once | INTRAVENOUS | Status: AC
  Administered 2013-01-13: 08:00:00 via INTRAVENOUS

## 2013-01-13 MED ORDER — OXYTOCIN 10 UNIT/ML IJ SOLN
40.0000 [IU] | INTRAVENOUS | Status: DC | PRN
  Administered 2013-01-13: 40 [IU] via INTRAVENOUS

## 2013-01-13 MED ORDER — LACTATED RINGERS IV SOLN
INTRAVENOUS | Status: DC
  Administered 2013-01-13: 19:00:00 via INTRAVENOUS

## 2013-01-13 MED ORDER — NALBUPHINE HCL 10 MG/ML IJ SOLN
5.0000 mg | INTRAMUSCULAR | Status: DC | PRN
  Filled 2013-01-13: qty 1

## 2013-01-13 SURGICAL SUPPLY — 35 items
BENZOIN TINCTURE PRP APPL 2/3 (GAUZE/BANDAGES/DRESSINGS) ×2 IMPLANT
CLAMP CORD UMBIL (MISCELLANEOUS) IMPLANT
CLOTH BEACON ORANGE TIMEOUT ST (SAFETY) ×2 IMPLANT
CONTAINER PREFILL 10% NBF 15ML (MISCELLANEOUS) IMPLANT
DRAPE LG THREE QUARTER DISP (DRAPES) ×2 IMPLANT
DRSG OPSITE POSTOP 4X10 (GAUZE/BANDAGES/DRESSINGS) ×2 IMPLANT
DURAPREP 26ML APPLICATOR (WOUND CARE) ×2 IMPLANT
ELECT REM PT RETURN 9FT ADLT (ELECTROSURGICAL) ×2
ELECTRODE REM PT RTRN 9FT ADLT (ELECTROSURGICAL) ×1 IMPLANT
EXTRACTOR VACUUM M CUP 4 TUBE (SUCTIONS) IMPLANT
GLOVE BIO SURGEON STRL SZ 6.5 (GLOVE) ×2 IMPLANT
GOWN STRL REIN XL XLG (GOWN DISPOSABLE) ×4 IMPLANT
KIT ABG SYR 3ML LUER SLIP (SYRINGE) IMPLANT
NEEDLE HYPO 25X5/8 SAFETYGLIDE (NEEDLE) IMPLANT
NS IRRIG 1000ML POUR BTL (IV SOLUTION) ×2 IMPLANT
PACK C SECTION WH (CUSTOM PROCEDURE TRAY) ×2 IMPLANT
PAD ABD 7.5X8 STRL (GAUZE/BANDAGES/DRESSINGS) ×2 IMPLANT
PAD OB MATERNITY 4.3X12.25 (PERSONAL CARE ITEMS) ×2 IMPLANT
RTRCTR C-SECT PINK 25CM LRG (MISCELLANEOUS) ×2 IMPLANT
STAPLER VISISTAT 35W (STAPLE) IMPLANT
STRIP CLOSURE SKIN 1/2X4 (GAUZE/BANDAGES/DRESSINGS) ×2 IMPLANT
SUT MNCRL 0 VIOLET CTX 36 (SUTURE) ×2 IMPLANT
SUT MONOCRYL 0 CTX 36 (SUTURE) ×2
SUT PLAIN 1 NONE 54 (SUTURE) IMPLANT
SUT PLAIN 2 0 XLH (SUTURE) ×2 IMPLANT
SUT VIC AB 0 CT1 27 (SUTURE) ×2
SUT VIC AB 0 CT1 27XBRD ANBCTR (SUTURE) ×2 IMPLANT
SUT VIC AB 2-0 CT1 27 (SUTURE) ×1
SUT VIC AB 2-0 CT1 TAPERPNT 27 (SUTURE) ×1 IMPLANT
SUT VIC AB 4-0 KS 27 (SUTURE) IMPLANT
SYR BULB IRRIGATION 50ML (SYRINGE) ×2 IMPLANT
TAPE CLOTH SURG 4X10 WHT LF (GAUZE/BANDAGES/DRESSINGS) ×2 IMPLANT
TOWEL OR 17X24 6PK STRL BLUE (TOWEL DISPOSABLE) ×6 IMPLANT
TRAY FOLEY CATH 14FR (SET/KITS/TRAYS/PACK) ×2 IMPLANT
WATER STERILE IRR 1000ML POUR (IV SOLUTION) ×2 IMPLANT

## 2013-01-13 NOTE — Anesthesia Procedure Notes (Signed)
Spinal  Patient location during procedure: OR Start time: 01/13/2013 9:03 AM Staffing Anesthesiologist: Sakshi Sermons A. Performed by: anesthesiologist  Preanesthetic Checklist Completed: patient identified, site marked, surgical consent, pre-op evaluation, timeout performed, IV checked, risks and benefits discussed and monitors and equipment checked Spinal Block Patient position: sitting Prep: site prepped and draped and DuraPrep Patient monitoring: heart rate, cardiac monitor, continuous pulse ox and blood pressure Approach: midline Location: L3-4 Injection technique: single-shot Needle Needle type: Sprotte  Needle gauge: 24 G Needle length: 9 cm Needle insertion depth: 5 cm Assessment Sensory level: T4 Additional Notes Patient tolerated procedure well. Adequate sensory level.

## 2013-01-13 NOTE — Transfer of Care (Signed)
Immediate Anesthesia Transfer of Care Note  Patient: [name]  Procedure(s) Performed: Procedure(s) with comments: REPEAT CESAREAN SECTION (N/A) - Jamie from office called and states pt is deaf and needs interpreter on day of surgery.  EDA Royal called and intrepreter scheduled for day of surgery.  Patient Location: PACU  Anesthesia Type:Spinal  Level of Consciousness: awake, alert  and oriented  Airway & Oxygen Therapy: Patient Spontanous Breathing  Post-op Assessment: Report given to PACU RN and Post -op Vital signs reviewed and stable  Post vital signs: stable  Complications: No apparent anesthesia complications

## 2013-01-13 NOTE — Anesthesia Preprocedure Evaluation (Signed)
Anesthesia Evaluation  Patient identified by MRN, date of birth, ID band Patient awake    Reviewed: Allergy & Precautions, H&P , NPO status , Patient's Chart, lab work & pertinent test results  History of Anesthesia Complications (+) PONV  Airway Mallampati: III TM Distance: >3 FB Neck ROM: Full    Dental no notable dental hx. (+) Teeth Intact   Pulmonary neg pulmonary ROS,  breath sounds clear to auscultation  Pulmonary exam normal       Cardiovascular negative cardio ROS  Rhythm:Regular Rate:Normal     Neuro/Psych negative neurological ROS  negative psych ROS   GI/Hepatic Neg liver ROS, GERD-  Medicated and Controlled,  Endo/Other  negative endocrine ROSObesity  Renal/GU negative Renal ROS  negative genitourinary   Musculoskeletal negative musculoskeletal ROS (+)   Abdominal (+) + obese,   Peds  Hematology negative hematology ROS (+)   Anesthesia Other Findings   Reproductive/Obstetrics (+) Pregnancy Previous C/Section for Breech                           Anesthesia Physical Anesthesia Plan  ASA: II  Anesthesia Plan: Spinal   Post-op Pain Management:    Induction:   Airway Management Planned: Natural Airway  Additional Equipment:   Intra-op Plan:   Post-operative Plan: Extubation in OR  Informed Consent: I have reviewed the patients History and Physical, chart, labs and discussed the procedure including the risks, benefits and alternatives for the proposed anesthesia with the patient or authorized representative who has indicated his/her understanding and acceptance.   Dental advisory given  Plan Discussed with: Anesthesiologist  Anesthesia Plan Comments:         Anesthesia Quick Evaluation

## 2013-01-13 NOTE — Brief Op Note (Signed)
01/13/2013  10:09 AM  PATIENT:  [name]  32 y.o. female  PRE-OPERATIVE DIAGNOSIS:  repeat c/section,   POST-OPERATIVE DIAGNOSIS:  repeat c/section  PROCEDURE:  Procedure(s) with comments: REPEAT CESAREAN SECTION (N/A) - Jamie from office called and states pt is deaf and needs interpreter on day of surgery.  EDA Royal called and intrepreter scheduled for day of surgery.  SURGEON:  Surgeon(s) and Role:    * Sherron Monday, MD - Primary  ASSISTANTS: Meisinger, Todd MD   FINDINGS: viable female infant at 9:22am apgars 8/9 of 1 and 5 min, weight P, nl uterus, tubes and ovaries.    ANESTHESIA:   spinal  EBL:  Total I/O In: 2800 [I.V.:2800] Out: 900 [Urine:200; Blood:700]  BLOOD ADMINISTERED:none  DRAINS: Urinary Catheter (Foley)   LOCAL MEDICATIONS USED:  NONE  SPECIMEN:  Source of Specimen:  Placenta  DISPOSITION OF SPECIMEN:  L&D  COUNTS:  YES  TOURNIQUET:  * No tourniquets in log *  DICTATION: .Other Dictation: Dictation Number [id number]  PLAN OF CARE: Admit to inpatient   PATIENT DISPOSITION:  PACU - hemodynamically stable.   Delay start of Pharmacological VTE agent (>24hrs) due to surgical blood loss or risk of bleeding: not applicable

## 2013-01-13 NOTE — Anesthesia Postprocedure Evaluation (Signed)
  Anesthesia Post-op Note  Patient: [name]  Procedure(s) Performed: Procedure(s) with comments: REPEAT CESAREAN SECTION (N/A) - Jamie from office called and states pt is deaf and needs interpreter on day of surgery.  EDA Royal called and intrepreter scheduled for day of surgery.  Patient Location: PACU  Anesthesia Type:Spinal  Level of Consciousness: awake, alert  and oriented  Airway and Oxygen Therapy: Patient Spontanous Breathing  Post-op Pain: none  Post-op Assessment: Post-op Vital signs reviewed, Patient's Cardiovascular Status Stable, Respiratory Function Stable, Patent Airway, No signs of Nausea or vomiting, Pain level controlled, No headache and No backache  Post-op Vital Signs: Reviewed and stable  Complications: No apparent anesthesia complications

## 2013-01-13 NOTE — Interval H&P Note (Signed)
History and Physical Interval Note:  01/13/2013 8:49 AM  [name]  has presented today for surgery, with the diagnosis of repeat c/section,   The various methods of treatment have been discussed with the patient and family. After consideration of risks, benefits and other options for treatment, the patient has consented to  Procedure(s) with comments: REPEAT CESAREAN SECTION (N/A) - Jamie from office called and states pt is deaf and needs interpreter on day of surgery.  EDA Royal called and intrepreter scheduled for day of surgery. as a surgical intervention .  The patient's history has been reviewed, patient examined, no change in status, stable for surgery.  I have reviewed the patient's chart and labs.  Questions were answered to the patient's satisfaction.     BOVARD,Jayro Mcmath

## 2013-01-13 NOTE — Anesthesia Postprocedure Evaluation (Signed)
  Anesthesia Post-op Note  Patient: [name]  Procedure(s) Performed: Procedure(s) with comments: REPEAT CESAREAN SECTION (N/A) - Jamie from office called and states pt is deaf and needs interpreter on day of surgery.  EDA Royal called and intrepreter scheduled for day of surgery.  Patient Location: Mother/Baby  Anesthesia Type:Spinal  Level of Consciousness: awake, alert  and oriented  Airway and Oxygen Therapy: Patient Spontanous Breathing  Post-op Pain: mild  Post-op Assessment: Post-op Vital signs reviewed, Patient's Cardiovascular Status Stable, No headache, No backache, No residual numbness and No residual motor weakness  Post-op Vital Signs: Reviewed and stable  Complications: No apparent anesthesia complications

## 2013-01-13 NOTE — Op Note (Signed)
[name], DERN                ACCOUNT NO.:  [id number]  MEDICAL RECORD NO.:  [id number]  LOCATION:  9107                          FACILITY:  [facility]  PHYSICIAN:  Sherron Monday, MD        DATE OF BIRTH:  1980/11/27  DATE OF PROCEDURE:  01/13/2013 DATE OF DISCHARGE:                              OPERATIVE REPORT   PREOPERATIVE DIAGNOSES:  Intrauterine pregnancy at 41 weeks, history of low-transverse cesarean section.  POSTOPERATIVE DIAGNOSES:  Intrauterine pregnancy at 41 weeks, history of low-transverse cesarean section, delivered.  PROCEDURE:  Repeat low-transverse cesarean section.  SURGEON:  Sherron Monday, MD.  ASSISTANT:  Zenaida Niece, M.D.  FINDINGS:  Viable female infant at 9:22 a.m. with Apgars of 8 at 1 minute and 9 at 5 minutes and weight pending at the time of dictation.  Normal uterus, tubes, and ovaries were noted at the time of surgery.  ANESTHESIA:  Spinal.  EBL:  700 mL.  IV FLUIDS:  2800 mL.  URINE OUTPUT:  200 mL of clear urine at the end of procedure.  COMPLICATIONS:  None.  PATHOLOGY:  Placenta to L and D.  DESCRIPTION OF PROCEDURE:  After informed consent was reviewed with the patient including the risks, benefits, and alternatives of surgical procedure, she was transported to the OR, where spinal anesthesia was placed and found to be adequate.  She was placed in the supine position with a leftward tilt.  She was prepped and draped in the normal sterile fashion.  Once this was found to be adequate by testing with the Allis test, a Pfannenstiel skin incision was made at the level of her previous incision, carried through the underlying layer of fascia sharply.  The fascia was incised in the midline and extended laterally with Mayo scissors.  The superior aspect of the fascial incision was grasped with Kocher clamps, elevated, and the rectus muscles were dissected off both bluntly and sharply.  Midline was easily identified and the peritoneum was  entered bluntly.  Incision was extended superiorly and inferiorly with good visualization of the bladder.  The Alexis skin retractor was placed, carefully making sure no bowel was entrapped.  The uterus was inspected the vesicouterine peritoneum was visualized.  The infant was delivered through a transverse uterine incision.  Nose and mouth were suctioned on the field.  Cord was clamped and cut.  Infant was handed off to the awaiting pediatric staff.  The placenta was expressed from the uterus.  The uterus was cleared of all clot and debris.  The uterine incision was closed in two layers of 0 Monocryl, the first of which was running locked and the second as an imbricating layer.  Hemostasis was assured.  Copious pelvic irrigation was performed.  The peritoneum was reapproximated with 2-0 Vicryl.  The subfascial planes were inspected and found to be hemostatic.  The fascia was closed with 0 Vicryl from either corner overlapping in the midline.  The subcuticular adipose layer was irrigated and made hemostatic with Bovie cautery.  The dead space was closed with 3-0 plain gut.  The skin was closed with 4-0 Vicryl in subcuticular fashion with the [facility] needle.  The patient  tolerated the procedure well.  Sponge, lap, and needle counts were correct x2.  Benzoin and Steri-Strips were applied.     Sherron Monday, MD     JB/MEDQ  D:  01/13/2013  T:  01/13/2013  Job:  [id number]

## 2013-01-14 ENCOUNTER — Encounter (HOSPITAL_COMMUNITY): Payer: Self-pay | Admitting: Obstetrics and Gynecology

## 2013-01-14 LAB — CBC
HCT: 29.3 % — ABNORMAL LOW (ref 36.0–46.0)
Hemoglobin: 9.9 g/dL — ABNORMAL LOW (ref 12.0–15.0)
MCHC: 33.8 g/dL (ref 30.0–36.0)
RBC: 3.1 MIL/uL — ABNORMAL LOW (ref 3.87–5.11)
WBC: 10.4 10*3/uL (ref 4.0–10.5)

## 2013-01-14 NOTE — Progress Notes (Signed)
Subjective: Postpartum Day 1: Cesarean Delivery Patient reports incisional pain and tolerating PO.  Nl lochia, pain controlled  Objective: Vital signs in last 24 hours: Temp:  [96.8 F (36 C)-98.7 F (37.1 C)] 98.5 F (36.9 C) (07/29 0500) Pulse Rate:  [64-94] 86 (07/29 0500) Resp:  [12-18] 18 (07/29 0500) BP: (94-117)/(50-82) 117/74 mmHg (07/29 0500) SpO2:  [93 %-100 %] 95 % (07/29 0500)  Physical Exam:  General: alert and no distress Lochia: appropriate Uterine Fundus: firm Incision: healing well DVT Evaluation: No evidence of DVT seen on physical exam.   Recent Labs  01/14/13 0615  HGB 9.9*  HCT 29.3*    Assessment/Plan: Status post Cesarean section. Doing well postoperatively.  Continue current care.  BOVARD,Averly Ericson 01/14/2013, 8:20 AM

## 2013-01-14 NOTE — Progress Notes (Signed)
UR chart review completed.  

## 2013-01-15 NOTE — Progress Notes (Addendum)
Subjective: Postpartum Day 2: Cesarean Delivery Patient reports incisional pain, tolerating PO and no problems voiding.  Nl lochia, pain controlled  Objective: Vital signs in last 24 hours: Temp:  [97.4 F (36.3 C)-98.3 F (36.8 C)] 98.3 F (36.8 C) (07/30 0515) Pulse Rate:  [73-94] 73 (07/30 0515) Resp:  [18-20] 18 (07/30 0515) BP: (105-118)/(64-75) 105/65 mmHg (07/30 0515) SpO2:  [99 %] 99 % (07/29 0930)  Physical Exam:  General: alert and no distress Lochia: appropriate Uterine Fundus: firm Incision: healing well DVT Evaluation: No evidence of DVT seen on physical exam.   Recent Labs  01/14/13 0615  HGB 9.9*  HCT 29.3*    Assessment/Plan: Status post Cesarean section. Doing well postoperatively.  Continue current care.  BOVARD,Lateasha Breuer 01/15/2013, 8:30 AM  Late entry: at 1pm d/w pt and interpreter POC.

## 2013-01-16 MED ORDER — PRENATAL MULTIVITAMIN CH: 1.0000 | ORAL_TABLET | Freq: Every day | ORAL | Status: DC

## 2013-01-16 MED ORDER — OXYCODONE-ACETAMINOPHEN 5-325 MG PO TABS: 1.0000 | ORAL_TABLET | Freq: Four times a day (QID) | ORAL | Status: DC | PRN

## 2013-01-16 MED ORDER — IBUPROFEN 800 MG PO TABS: 800.0000 mg | ORAL_TABLET | Freq: Three times a day (TID) | ORAL | Status: DC

## 2013-01-16 NOTE — Discharge Summary (Signed)
Obstetric Discharge Summary Reason for Admission: cesarean section Prenatal Procedures: none Intrapartum Procedures: cesarean: low cervical, transverse Postpartum Procedures: none Complications-Operative and Postpartum: none Hemoglobin  Date Value Range Status  01/14/2013 9.9* 12.0 - 15.0 g/dL Final     HCT  Date Value Range Status  01/14/2013 29.3* 36.0 - 46.0 % Final    Physical Exam:  General: alert and no distress Lochia: appropriate Uterine Fundus: firm Incision: healing well DVT Evaluation: No evidence of DVT seen on physical exam.  Discharge Diagnoses: Term Pregnancy-delivered  Discharge Information: Date: 01/16/2013 Activity: pelvic rest Diet: routine Medications: PNV, Ibuprofen and Percocet Condition: stable Instructions: refer to practice specific booklet Discharge to: home Follow-up Information   Follow up with BOVARD,Baby Gieger, MD. Schedule an appointment as soon as possible for a visit in 2 weeks. (Incision Check)    Contact information:   [address] [place] [address] [phone]       Newborn Data: Live born female  Birth Weight: 9 lb 2 oz (4140 g) APGAR: 8, 9  Home with mother.  BOVARD,Lodema Parma 01/16/2013, 8:58 AM

## 2013-01-16 NOTE — Progress Notes (Signed)
Subjective: Postpartum Day 3: Cesarean Delivery Patient reports incisional pain, tolerating PO and no problems voiding.  Nl lochia, pain controlled  Objective: Vital signs in last 24 hours: Temp:  [98.6 F (37 C)-98.8 F (37.1 C)] 98.8 F (37.1 C) (07/31 0556) Pulse Rate:  [87-93] 87 (07/31 0556) Resp:  [17-18] 17 (07/31 0556) BP: (113-125)/(62-64) 125/62 mmHg (07/31 0556) SpO2:  [97 %] 97 % (07/31 0556)  Physical Exam:  General: alert and no distress Lochia: appropriate Uterine Fundus: firm   Recent Labs  01/14/13 0615  HGB 9.9*  HCT 29.3*    Assessment/Plan: Status post Cesarean section. Doing well postoperatively.  Discharge home with standard precautions and return to clinic in 2 weeks. D/C with Motrin/percocet/ pnv.  Doing well.    BOVARD,Derreon Consalvo 01/16/2013, 8:30 AM

## 2013-09-15 ENCOUNTER — Emergency Department (HOSPITAL_COMMUNITY)
Admission: EM | Admit: 2013-09-15 | Discharge: 2013-09-16 | Disposition: A | Discharge status: Home / Self Care | Payer: Medicaid Other | Attending: Emergency Medicine | Admitting: Emergency Medicine

## 2013-09-15 ENCOUNTER — Encounter (HOSPITAL_COMMUNITY): Payer: Self-pay | Admitting: Emergency Medicine

## 2013-09-15 DIAGNOSIS — R319 Hematuria, unspecified: Secondary | ICD-10-CM | POA: Insufficient documentation

## 2013-09-15 DIAGNOSIS — Z8719 Personal history of other diseases of the digestive system: Secondary | ICD-10-CM | POA: Insufficient documentation

## 2013-09-15 DIAGNOSIS — R300 Dysuria: Secondary | ICD-10-CM | POA: Insufficient documentation

## 2013-09-15 DIAGNOSIS — Z9889 Other specified postprocedural states: Secondary | ICD-10-CM | POA: Insufficient documentation

## 2013-09-15 DIAGNOSIS — R350 Frequency of micturition: Secondary | ICD-10-CM | POA: Insufficient documentation

## 2013-09-15 DIAGNOSIS — Z8669 Personal history of other diseases of the nervous system and sense organs: Secondary | ICD-10-CM | POA: Insufficient documentation

## 2013-09-15 DIAGNOSIS — R399 Unspecified symptoms and signs involving the genitourinary system: Secondary | ICD-10-CM

## 2013-09-15 DIAGNOSIS — Z3202 Encounter for pregnancy test, result negative: Secondary | ICD-10-CM | POA: Insufficient documentation

## 2013-09-15 NOTE — ED Provider Notes (Signed)
CSN: [id number]     Arrival date & time 09/15/13  2228 History   First MD Initiated Contact with Patient 09/15/13 2323     Chief Complaint  Patient presents with  . Vaginal Bleeding     (Consider location/radiation/quality/duration/timing/severity/associated sxs/prior Treatment) The history is provided by the patient. The history is limited by a language barrier. A language interpreter was used.   33 yo G2P2 female with recent hx of IUD placement presents with Dysuria, urinary frequency, and urgency that started earlier today. Patient states she is experiencing burning pain with urination. Denies ever having these sxs before. Denies hx of UTI in past. Patient states sexually active approximately 2-3 days ago with husband. No new partners. No vaginal discharge or pain. No pain with intercourse. No fever/chills, no abdominal pain, no back pain, no N/V/D/C. Patient admits to some spotting and irregular periods since starting on IUD.   Past Medical History  Diagnosis Date  . Deafness   . PONV (postoperative nausea and vomiting)   . Gastroesophageal reflux in pregancy   . Normal pregnancy, repeat 01/12/2013  . S/P cesarean section 01/13/2013   Past Surgical History  Procedure Laterality Date  . Cesarean section    . Wisdom tooth extraction    . Cesarean section N/A 01/13/2013    Procedure: REPEAT CESAREAN SECTION;  Surgeon: Thornell Sartorius, MD;  Location: [facility];  Service: Obstetrics;  Laterality: N/A;  Jamie from office called and states pt is deaf and needs interpreter on day of surgery.  EDA Royal called and intrepreter scheduled for day of surgery.   Family History  Problem Relation Age of Onset  . Cancer Mother   . Cancer Other   . Hypertension Other    History  Substance Use Topics  . Smoking status: Never Smoker   . Smokeless tobacco: Never Used  . Alcohol Use: No   OB History   Grav Para Term Preterm Abortions TAB SAB Ect Mult Living   2 2 2       2      Review of Systems   All other systems reviewed and are negative.      Allergies  Review of patient's allergies indicates no known allergies.  Home Medications   Current Outpatient Rx  Name  Route  Sig  Dispense  Refill  . cephALEXin (KEFLEX) 500 MG capsule   Oral   Take 1 capsule (500 mg total) by mouth 2 (two) times daily.   14 capsule   0   . phenazopyridine (PYRIDIUM) 200 MG tablet   Oral   Take 1 tablet (200 mg total) by mouth 3 (three) times daily.   6 tablet   0    BP 128/77  Pulse 84  Temp(Src) 98.2 F (36.8 C) (Oral)  Resp 18  Ht 5' (1.524 m)  Wt 147 lb 9.6 oz (66.951 kg)  BMI 28.83 kg/m2  SpO2 99% Physical Exam  Nursing note and vitals reviewed. Constitutional: She is oriented to person, place, and time. She appears well-developed and well-nourished. No distress.  HENT:  Head: Normocephalic and atraumatic.  Eyes: Conjunctivae are normal. No scleral icterus.  Neck: No JVD present. No tracheal deviation present.  Cardiovascular: Normal rate and regular rhythm.  Exam reveals no gallop and no friction rub.   No murmur heard. Pulmonary/Chest: Effort normal and breath sounds normal. No respiratory distress. She has no wheezes. She has no rhonchi. She has no rales.  Abdominal: Soft. Bowel sounds are normal. She exhibits no  distension. There is no hepatosplenomegaly. There is tenderness in the suprapubic area. There is no rigidity, no rebound, no guarding, no CVA tenderness, no tenderness at McBurney's point and negative Murphy's sign.  Genitourinary:  Pelvic exam performed by PA Jeannett Senior, at patient's request for a female examiner. Refer to note for details of exam.   Musculoskeletal: She exhibits no edema.  Neurological: She is alert and oriented to person, place, and time.  Skin: Skin is warm and dry. She is not diaphoretic.  Psychiatric: She has a normal mood and affect. Her behavior is normal.    ED Course  Procedures (including critical care time) Labs  Review Labs Reviewed  WET PREP, GENITAL - Abnormal; Notable for the following:    Clue Cells Wet Prep HPF POC MANY (*)    WBC, Wet Prep HPF POC FEW (*)    All other components within normal limits  URINALYSIS, ROUTINE W REFLEX MICROSCOPIC - Abnormal; Notable for the following:    APPearance CLOUDY (*)    Hgb urine dipstick LARGE (*)    Leukocytes, UA MODERATE (*)    All other components within normal limits  GC/CHLAMYDIA PROBE AMP  URINE MICROSCOPIC-ADD ON  POC URINE PREG, ED   Imaging Review No results found.   EKG Interpretation None      MDM   Final diagnoses:  UTI symptoms  Hematuria   Patient afebrile with normal VS.  Patient presents with classic sxs of UTI. UA shows Moderate leukocytes, large hgb, and 7-10 WBCs on microscopy. Indicative of UTI.  Patient presentation of atypical of kidney stones, with no hx of such in the past. Doubt nephrolithiasis.  Pelvic exam, per PA Kirichenko shows no evidence of blood in vaginal vault. Suspect Hematuria likely from UTI.  Urine preg negative. Wet prep positive for clue cells, though does not explain patient's sxs. Suspect patient likely with asymptomatic BV.  Plan to treat patient's UTI and have her follow up with PCP in 2 days for reevaluation. Patient advised to return if sxs do not resolve with treatment or becomes unable to urinate.  Discussed lab and exam findings with patient. Interpreter used. Patient understanding confirmed. Discharged in good condition.   Meds given in ED:  Medications - No data to display  Discharge Medication List as of 09/16/2013  1:54 AM    START taking these medications   Details  cephALEXin (KEFLEX) 500 MG capsule Take 1 capsule (500 mg total) by mouth 2 (two) times daily., Starting 09/16/2013, Until Discontinued, Print    phenazopyridine (PYRIDIUM) 200 MG tablet Take 1 tablet (200 mg total) by mouth 3 (three) times daily., Starting 09/16/2013, Until Discontinued, Print           Sherrie George, PA-C 09/16/13 1256

## 2013-09-15 NOTE — ED Notes (Signed)
Pt states she is having vaginal bleeding that started this evening   Pt has an IUD  Pt states she has pain after she finishes urinating  Pt states she has not had a period in 2 mths and this is not regular for her

## 2013-09-16 LAB — URINE MICROSCOPIC-ADD ON

## 2013-09-16 LAB — URINALYSIS, ROUTINE W REFLEX MICROSCOPIC
BILIRUBIN URINE: NEGATIVE
GLUCOSE, UA: NEGATIVE mg/dL
Ketones, ur: NEGATIVE mg/dL
Nitrite: NEGATIVE
Protein, ur: NEGATIVE mg/dL
SPECIFIC GRAVITY, URINE: 1.011 (ref 1.005–1.030)
UROBILINOGEN UA: 1 mg/dL (ref 0.0–1.0)
pH: 6.5 (ref 5.0–8.0)

## 2013-09-16 LAB — WET PREP, GENITAL
Trich, Wet Prep: NONE SEEN
YEAST WET PREP: NONE SEEN

## 2013-09-16 LAB — POC URINE PREG, ED: Preg Test, Ur: NEGATIVE

## 2013-09-16 MED ORDER — PHENAZOPYRIDINE HCL 200 MG PO TABS: 200.0000 mg | ORAL_TABLET | Freq: Three times a day (TID) | ORAL | Status: DC

## 2013-09-16 MED ORDER — CEPHALEXIN 500 MG PO CAPS: 500.0000 mg | ORAL_CAPSULE | Freq: Two times a day (BID) | ORAL | Status: DC

## 2013-09-16 NOTE — ED Provider Notes (Signed)
Medical screening examination/treatment/procedure(s) were performed by non-physician practitioner and as supervising physician I was immediately available for consultation/collaboration.   EKG Interpretation None        Mariea Clonts, MD 09/16/13 [phone]

## 2013-09-16 NOTE — ED Provider Notes (Signed)
Pelvic exam performed by me. Pt with suprapubic pressure, dysuria, burning with urination. No vaginal complaints.   Exam: Normal external genitalia. Normal vaginal canal. White thin vaginal discharge present. Cervix normal. IUD strings noted. No CMT, no uterine, no adnexal tenderness.     Renold Genta, PA-C 09/16/13 [phone]

## 2013-09-16 NOTE — Discharge Instructions (Signed)
Take medications as directed for symptoms of urinary tract infection. Follow up with a primary care doctor in 2 days to ensure symptom improvement. Return to Emergency department for any worsening symptoms. Resource guide below for follow up.    Emergency Department Resource Guide 1) Find a Doctor and Pay Out of Pocket Although you won't have to find out who is covered by your insurance plan, it is a good idea to ask around and get recommendations. You will then need to call the office and see if the doctor you have chosen will accept you as a new patient and what types of options they offer for patients who are self-pay. Some doctors offer discounts or will set up payment plans for their patients who do not have insurance, but you will need to ask so you aren't surprised when you get to your appointment.  2) Contact Your Local Health Department Not all health departments have doctors that can see patients for sick visits, but many do, so it is worth a call to see if yours does. If you don't know where your local health department is, you can check in your phone book. The CDC also has a tool to help you locate your state's health department, and many state websites also have listings of all of their local health departments.  3) Find a [facility] If your illness is not likely to be very severe or complicated, you may want to try a walk in clinic. These are popping up all over the country in pharmacies, drugstores, and shopping centers. They're usually staffed by nurse practitioners or physician assistants that have been trained to treat common illnesses and complaints. They're usually fairly quick and inexpensive. However, if you have serious medical issues or chronic medical problems, these are probably not your best option.  No Primary Care Doctor: - Call Health Connect at  [phone] - they can help you locate a primary care doctor that  accepts your insurance, provides certain services,  etc. - Physician Referral Service- [phone]  Chronic Pain Problems: Organization         Address  Phone   Notes  [facility]  [phone] Patients need to be referred by their primary care doctor.   Medication Assistance: Organization         Address  Phone   Notes  [facility] Medication [facility] [address] [place], [place] [address] [phone] --Must be a resident of [facility] -- Must have NO insurance coverage whatsoever (no Medicaid/ Medicare, etc.) -- The pt. MUST have a primary care doctor that directs their care regularly and follows them in the community   Med[facility]ist  [phone]   [employer]  [phone]    Agencies that provide inexpensive medical care: Organization         Address  Phone   Notes  [place]  [phone]   [facility]    [phone]   [facility] [place], [place] [address] [phone]   [place] [address], [place] [phone]   Planned Parenthood    [phone]   [facility]    [phone]   [place] and [address], [place] Phone:  [phone], Fax:  [phone] Hours of Operation:  9 am - 6 pm, M-F.  Also accepts Medicaid/Medicare and self-pay.  [facility] [place], [address], [place] Phone: [phone], Fax: [phone]. Hours of Operation:  8:30 am - 5:30 pm, M-F.  Also accepts Medicaid and self-pay.  [facility] [place] [address] [place] Phone: [phone]   [place], [place], [place] [phone], Ext. [phone] Mondays & Thursdays: 7-9 AM.  First 15 patients are seen on a first come, first serve basis.    [place] Providers:  Organization         Address  Phone   Notes  [facility] [address], [place] [phone] Also accepts self-pay patients.  [facility] [address], [place]  [phone]   [place], [address], [place] [phone]   [facility] [address], [place] [phone]   Lucianne Lei [address], [place]   [phone] Only accepts [place] Access [place] patients after they have their name applied to their card.   Self-Pay (no insurance) in [facility] [place]:  Organization         Address  Phone   Notes  Sickle Cell Patients, [facility] [place] [phone]   [facility] [place] [phone]   [facility] [place]  [place], [place], [place] [phone]   [facility]/Dr. Osei-Bonsu  [address] [place] or [address], [place] [phone] Phone number for both [place] and [place] locations is the same.  Urgent Medical and [facility] [address], [place] [phone]   [facility] [address], [place] or [address] [phone]   [facility] [address] [place] [phone], phone; [phone], fax Sees patients 1st and 3rd Saturday of every month.  Must not qualify for public or private insurance (i.e. Medicaid, Medicare, [place] Health Choice, Veterans' Benefits)  Household income should be no more than 200% of the poverty level The clinic cannot treat you if you are pregnant or think you are pregnant  Sexually transmitted diseases are not treated at the clinic.    Dental Care: Organization         Address  Phone  Notes  [facility] Department of [facility] [place] [phone] Accepts children up to  age 78 who are enrolled in [place] or [place]; pregnant women with a Medicaid card; and children who have applied for Medicaid or [place] Health Choice, but were declined, whose parents can pay a reduced fee at time of service.  [facility] Department of [facility]  [address], [place] [phone] Accepts children up to age 39 who are enrolled in [place] or [place]; pregnant women with a Medicaid card; and children who have applied for Medicaid or [place] Health Choice, but were declined, whose parents can pay a reduced fee at time of service.  [facility] Adult Dental Access PROGRAM  [place] [phone] Patients are seen by appointment only. Walk-ins are not accepted. [place] will see patients 78 years of age and older. Monday - Tuesday (8am-5pm) Most Wednesdays (8:30-5pm) $30 per  visit, cash only  [facility] Adult [employer] PROGRAM  [address], [facility] [phone] Patients are seen by appointment only. Walk-ins are not accepted. [place] will see patients 25 years of age and older. One Wednesday Evening (Monthly: Volunteer Based).  $30 per visit, cash only  [place]  [phone] for adults; Children under age 88, call Graduate Pediatric Dentistry at [phone]. Children aged 53-14, please call [phone] to request a pediatric application.  Dental services are provided in all areas of dental care including fillings, crowns and bridges, complete and partial dentures, implants, gum treatment, root canals, and extractions. Preventive care is also provided. Treatment is provided to both adults and children. Patients are selected via a lottery and there is often a waiting list.   [facility] [address] [place]  [phone] [email or web address]   [facility] [address] [place], [place] [phone], Ext. [phone] Second and Fourth Thursday of  each month, opens at 6:30 AM; Clinic ends at 9 AM.  Patients are seen on a first-come first-served basis, and a limited number are seen during each clinic.   [facility]  [address] Hillard Danker [place], [place] [phone]   Eligibility Requirements You must have lived in [place], [place], or [place] counties for at least the last three months.   You cannot be eligible for state or federal sponsored [employer], including [employer], [place], or [employer].   You generally cannot be eligible for healthcare insurance through your employer.    How to apply: Eligibility screenings are held every Tuesday and Wednesday afternoon from 1:00 pm until 4:00 pm. You do not need an appointment for the interview!  [facility] [address], [place], [place]   [place]  [place] Department  [place]  [phone]    Behavioral Health Resources in the Community: Intensive Outpatient Programs Organization         Address  Phone  Notes  [place]. [address] [place], [place] [phone]   [facility] Outpatient [address] [place], [place]   ADS: Alcohol & Drug Svcs [address] [place], [place]   [place] [address],  [place], [place] or [phone]   Substance Abuse Resources Organization         Address  Phone  Notes  Alcohol and Drug Services  [phone]   [place]  [phone]   [facility] [place]   Chinita Pester  [phone]   Residential & Outpatient Substance Abuse Program  [phone]   Psychological Services Organization         Address  Phone  Notes  [facility]  [place]  [phone]   [place] [address],  [place] or [phone]    Mobile Crisis Teams Organization         Address  Phone  Notes  [facility], Mobile Crisis Care Unit  [phone]   [facility]  [address] [place], [place]   [place] [address], [place] [phone]    Self-Help/Support Groups Organization         Address  Phone  Notes  Mental Health Assoc. of [place] - variety of support groups  [place] Call for more information  Narcotics Anonymous (NA), [facility] [address], [employer] [place]  2 meetings at this location   Special educational needs teacher         Address  Phone  Notes  ASAP Residential Treatment [place],    [place]  [phone]   [facility]  [address], [place] [phone], [place], [place]   [place], [place] [phone] Admissions: 8am-3pm M-F  Incentives Substance [place] [address],    [place], [place] [phone]   [facility] [address] [place], [place], [place]   [facility] [address]  [place], [place]   [facility] Programs - Intensive Outpatient [address], [place], [place]   [facility] ([place]  [place], [place] [phone] or [phone]   [facility] ([facility]) [address] [place], [place] Accepts Medicaid  [facility] [place] [address],  [place] [phone] Substance Abuse/Addiction Treatment   [facility] Organization         Address  Phone  Notes  [facility]  [phone]   Domenic Schwab, PhD [address] [place]lis [place], [place]   [phone] or [phone]   [place], [place] [phone]     [facility] [address], [place], [place] [phone] Insurance/Medicaid/sponsorship through [facility] and Families [address], [address]                                    [place], [place] [phone] [place] [address], [place] [phone]    Dr. Adele Schilder  [phone]   [facility] Dept. 1) [address], [place] 2) [place] 3)  [address], [place] [phone]  [phone]   [place] [phone] or [phone] (After Hours)

## 2013-09-17 LAB — GC/CHLAMYDIA PROBE AMP
CT Probe RNA: NEGATIVE
GC Probe RNA: NEGATIVE

## 2013-09-17 NOTE — ED Provider Notes (Signed)
Medical screening examination/treatment/procedure(s) were performed by non-physician practitioner and as supervising physician I was immediately available for consultation/collaboration.   EKG Interpretation None       Jasper Riling. Alvino Chapel, MD 09/17/13 [phone]

## 2014-04-20 ENCOUNTER — Encounter (HOSPITAL_COMMUNITY): Payer: Self-pay | Admitting: Emergency Medicine

## 2014-12-02 LAB — OB RESULTS CONSOLE ABO/RH: RH TYPE: POSITIVE

## 2014-12-02 LAB — OB RESULTS CONSOLE GC/CHLAMYDIA
Chlamydia: NEGATIVE
GC PROBE AMP, GENITAL: NEGATIVE

## 2014-12-02 LAB — OB RESULTS CONSOLE ANTIBODY SCREEN: Antibody Screen: NEGATIVE

## 2014-12-02 LAB — OB RESULTS CONSOLE RUBELLA ANTIBODY, IGM: Rubella: IMMUNE

## 2014-12-02 LAB — OB RESULTS CONSOLE HEPATITIS B SURFACE ANTIGEN: Hepatitis B Surface Ag: NEGATIVE

## 2014-12-02 LAB — OB RESULTS CONSOLE HIV ANTIBODY (ROUTINE TESTING): HIV: NONREACTIVE

## 2015-03-09 ENCOUNTER — Telehealth: Payer: Self-pay | Admitting: General Practice

## 2015-03-09 NOTE — Telephone Encounter (Signed)
Dr Nehemiah Massed office is requesting a referral for 6 month fu for acne DX Code: L70.0  Last seen here on 02-19-14 Please fax to [phone] ATTN: Marcheta Grammes

## 2015-03-18 NOTE — Telephone Encounter (Signed)
Done, faxed to Dr. Alveria Apley office with authorization of 6 visits.  [employer] [facility] NPI# [id number]

## 2015-04-06 LAB — OB RESULTS CONSOLE HIV ANTIBODY (ROUTINE TESTING): HIV: NONREACTIVE

## 2015-04-06 LAB — OB RESULTS CONSOLE RPR: RPR: NONREACTIVE

## 2015-06-01 LAB — OB RESULTS CONSOLE GBS: GBS: POSITIVE

## 2015-06-23 ENCOUNTER — Encounter (HOSPITAL_COMMUNITY): Payer: Self-pay

## 2015-06-25 ENCOUNTER — Encounter (HOSPITAL_COMMUNITY): Payer: Self-pay

## 2015-06-25 ENCOUNTER — Encounter (HOSPITAL_COMMUNITY)
Admission: RE | Admit: 2015-06-25 | Discharge: 2015-06-25 | Disposition: A | Discharge status: Home / Self Care | Payer: Medicaid Other | Source: Ambulatory Visit | Attending: Obstetrics and Gynecology | Admitting: Obstetrics and Gynecology

## 2015-06-25 DIAGNOSIS — Z01812 Encounter for preprocedural laboratory examination: Secondary | ICD-10-CM | POA: Diagnosis present

## 2015-06-25 DIAGNOSIS — Z98891 History of uterine scar from previous surgery: Secondary | ICD-10-CM | POA: Insufficient documentation

## 2015-06-25 DIAGNOSIS — Z3A39 39 weeks gestation of pregnancy: Secondary | ICD-10-CM | POA: Insufficient documentation

## 2015-06-25 HISTORY — DX: Gastro-esophageal reflux disease without esophagitis: K21.9

## 2015-06-25 LAB — CBC
HCT: 34.1 % — ABNORMAL LOW (ref 36.0–46.0)
HEMOGLOBIN: 11 g/dL — AB (ref 12.0–15.0)
MCH: 30 pg (ref 26.0–34.0)
MCHC: 32.3 g/dL (ref 30.0–36.0)
MCV: 92.9 fL (ref 78.0–100.0)
Platelets: 190 10*3/uL (ref 150–400)
RBC: 3.67 MIL/uL — AB (ref 3.87–5.11)
RDW: 14.8 % (ref 11.5–15.5)
WBC: 9 10*3/uL (ref 4.0–10.5)

## 2015-06-25 NOTE — Patient Instructions (Addendum)
Your procedure is scheduled on:  June 28, 2015   Enter through the Main Entrance of [facility] at: 6:00 am    Pick up the phone at the desk and dial [phone].  Call this number if you have problems the morning of surgery: [phone].  Remember: Do NOT eat food: after midnight on Sunday  Do NOT drink clear liquids after: midnight on Sunday  Take these medicines the morning of surgery with a SIP OF WATER: none    Do NOT wear jewelry (body piercing), metal hair clips/bobby pins, or nail polish. Do NOT wear lotions, powders, or perfumes.  You may wear deoderant. Do NOT shave for 48 hours prior to surgery. Do NOT bring valuables to the hospital. Leave suitcase in car.  After surgery it may be brought to your room.  For patients admitted to the hospital, checkout time is 11:00 AM the day of discharge.

## 2015-06-26 LAB — RPR: RPR Ser Ql: NONREACTIVE

## 2015-06-27 ENCOUNTER — Encounter (HOSPITAL_COMMUNITY): Payer: Self-pay | Admitting: Anesthesiology

## 2015-06-27 DIAGNOSIS — Z3480 Encounter for supervision of other normal pregnancy, unspecified trimester: Secondary | ICD-10-CM

## 2015-06-27 NOTE — Anesthesia Preprocedure Evaluation (Addendum)
Anesthesia Evaluation  Patient identified by MRN, date of birth, ID band Patient awake    Reviewed: Allergy & Precautions, H&P , Patient's Chart, lab work & pertinent test results  History of Anesthesia Complications (+) PONV and history of anesthetic complications  Airway Mallampati: I  TM Distance: >3 FB Neck ROM: full    Dental no notable dental hx.    Pulmonary neg pulmonary ROS,    Pulmonary exam normal breath sounds clear to auscultation       Cardiovascular negative cardio ROS Normal cardiovascular exam     Neuro/Psych negative neurological ROS  negative psych ROS   GI/Hepatic Neg liver ROS,   Endo/Other  negative endocrine ROS  Renal/GU negative Renal ROS     Musculoskeletal   Abdominal (+) + obese,   Peds  Hematology negative hematology ROS (+)   Anesthesia Other Findings   Reproductive/Obstetrics (+) Pregnancy                            Anesthesia Physical Anesthesia Plan  ASA: II  Anesthesia Plan: Spinal   Post-op Pain Management:    Induction:   Airway Management Planned:   Additional Equipment:   Intra-op Plan:   Post-operative Plan:   Informed Consent: I have reviewed the patients History and Physical, chart, labs and discussed the procedure including the risks, benefits and alternatives for the proposed anesthesia with the patient or authorized representative who has indicated his/her understanding and acceptance.     Plan Discussed with: CRNA and Surgeon  Anesthesia Plan Comments:        Anesthesia Quick Evaluation

## 2015-06-27 NOTE — H&P (Signed)
KARIN OHLENDORF is a 35 y.o. female G3P2002 at 39+for rLTCS. Pt with history of LTCS x 2, first for breech presentation.  Relatively uncomplicated PNC.  Pt is deaf after menigitis at age 53 - uses sign language interpreter - present at prenatal care visits.  +FM, no LOF, no VB, occ ctx.  EDC by LMP cw First trimester Ultrasound.  Normal genetic screening.   Maternal Medical History:  Contractions: Frequency: irregular.    Fetal activity: Perceived fetal activity is normal.    Prenatal Complications - Diabetes: none.    OB History    Gravida Para Term Preterm AB TAB SAB Ectopic Multiple Living   3 2 2       2     G1 7#13, female, breech LTCS G2 9#2 female LTCS G3 present  No abn pap, last 07/25/13, HR HPV neg No STD  Past Medical History  Diagnosis Date  . Deafness   . PONV (postoperative nausea and vomiting)   . Gastroesophageal reflux in pregancy   . Normal pregnancy, repeat 01/12/2013  . S/P cesarean section 01/13/2013  . GERD (gastroesophageal reflux disease)   . Normal pregnancy, repeat 06/27/2015   Past Surgical History  Procedure Laterality Date  . Cesarean section    . Wisdom tooth extraction    . Cesarean section N/A 01/13/2013    Procedure: REPEAT CESAREAN SECTION;  Surgeon: Thornell Sartorius, MD;  Location: [facility];  Service: Obstetrics;  Laterality: N/A;  Jamie from office called and states pt is deaf and needs interpreter on day of surgery.  EDA Royal called and intrepreter scheduled for day of surgery.   Family History: family history includes Cancer in her mother and other; Hypertension in her other.also DM and CAD/MI Social History:  reports that she has never smoked. She has never used smokeless tobacco. She reports that she does not drink alcohol or use illicit drugs.SAHM, married  Meds PNV [place]   Prenatal Transfer Tool  Maternal Diabetes: No Genetic Screening: Normal Maternal Ultrasounds/Referrals: Normal Fetal Ultrasounds or other Referrals:  None Maternal  Substance Abuse:  No Significant Maternal Medications:  None Significant Maternal Lab Results:  Lab values include: Group B Strep negative Other Comments:  None  Review of Systems  Constitutional: Negative.   HENT: Negative.   Eyes: Negative.   Respiratory: Negative.   Cardiovascular: Negative.   Gastrointestinal: Negative.   Genitourinary: Negative.   Musculoskeletal: Positive for back pain.  Skin: Negative.   Neurological: Negative.   Psychiatric/Behavioral: Negative.       Last menstrual period 09/25/2014, unknown if currently breastfeeding. Maternal Exam:  Abdomen: Patient reports no abdominal tenderness. Surgical scars: low transverse.   Fundal height is appropriate for gestation.   Estimated fetal weight is 8-9#.   Fetal presentation: vertex  Introitus: Normal vulva. Normal vagina.    Physical Exam  Constitutional: She is oriented to person, place, and time. She appears well-developed and well-nourished.  HENT:  Head: Normocephalic and atraumatic.  Cardiovascular: Normal rate and regular rhythm.   Respiratory: Effort normal and breath sounds normal. No respiratory distress. She has no wheezes.  GI: Soft. Bowel sounds are normal. She exhibits no distension. There is no tenderness.  Musculoskeletal: Normal range of motion.  Neurological: She is alert and oriented to person, place, and time.  Skin: Skin is warm and dry.  Psychiatric: She has a normal mood and affect. Her behavior is normal.    Prenatal labs: ABO, Rh: --/--/O POS (01/06 1150) Antibody: NEG (01/06 1150)  Rubella: Immune (06/15 0000) RPR: Non Reactive (01/06 1150)  HBsAg: Negative (06/15 0000)  HIV: Non-reactive (10/18 0000)  GBS: Positive (12/13 0000)   Tdap/Flu 04/06/15  Hgb 12.8/Ur Cx neg/GC neg/Chl neg/Firat Trimester Screen WNL/glucola = 85  [place] cwd, nl early [place], nl NT [place] nl anat, fundal plac, female  Assessment/Plan:34yo G3P2002 at 39+ for repeat LTCS Ancef for prophylaxis Spinal for pain  control D/w pt r/b/a of rLTCS   Bovard-Stuckert, Eitan Doubleday 06/27/2015, 8:47 PM

## 2015-06-28 ENCOUNTER — Encounter (HOSPITAL_COMMUNITY): Payer: Self-pay | Admitting: Emergency Medicine

## 2015-06-28 ENCOUNTER — Encounter (HOSPITAL_COMMUNITY)
Admission: RE | Disposition: A | Discharge status: Home / Self Care | Payer: Self-pay | Source: Ambulatory Visit | Attending: Obstetrics and Gynecology

## 2015-06-28 ENCOUNTER — Inpatient Hospital Stay (HOSPITAL_COMMUNITY): Payer: Medicaid Other | Admitting: Anesthesiology

## 2015-06-28 ENCOUNTER — Inpatient Hospital Stay (HOSPITAL_COMMUNITY)
Admission: RE | Admit: 2015-06-28 | Discharge: 2015-07-01 | DRG: 766 | Disposition: A | Discharge status: Home / Self Care | Payer: Medicaid Other | Source: Ambulatory Visit | Attending: Obstetrics and Gynecology | Admitting: Obstetrics and Gynecology

## 2015-06-28 DIAGNOSIS — K219 Gastro-esophageal reflux disease without esophagitis: Secondary | ICD-10-CM | POA: Diagnosis present

## 2015-06-28 DIAGNOSIS — O9962 Diseases of the digestive system complicating childbirth: Secondary | ICD-10-CM | POA: Diagnosis present

## 2015-06-28 DIAGNOSIS — H9190 Unspecified hearing loss, unspecified ear: Secondary | ICD-10-CM | POA: Diagnosis present

## 2015-06-28 DIAGNOSIS — Z8249 Family history of ischemic heart disease and other diseases of the circulatory system: Secondary | ICD-10-CM

## 2015-06-28 DIAGNOSIS — Z3A39 39 weeks gestation of pregnancy: Secondary | ICD-10-CM | POA: Diagnosis not present

## 2015-06-28 DIAGNOSIS — Z809 Family history of malignant neoplasm, unspecified: Secondary | ICD-10-CM | POA: Diagnosis not present

## 2015-06-28 DIAGNOSIS — E669 Obesity, unspecified: Secondary | ICD-10-CM | POA: Diagnosis present

## 2015-06-28 DIAGNOSIS — Z833 Family history of diabetes mellitus: Secondary | ICD-10-CM

## 2015-06-28 DIAGNOSIS — Z6835 Body mass index (BMI) 35.0-35.9, adult: Secondary | ICD-10-CM | POA: Diagnosis not present

## 2015-06-28 DIAGNOSIS — O34211 Maternal care for low transverse scar from previous cesarean delivery: Secondary | ICD-10-CM | POA: Diagnosis present

## 2015-06-28 DIAGNOSIS — O99214 Obesity complicating childbirth: Secondary | ICD-10-CM | POA: Diagnosis present

## 2015-06-28 DIAGNOSIS — Z98891 History of uterine scar from previous surgery: Secondary | ICD-10-CM

## 2015-06-28 DIAGNOSIS — Z3480 Encounter for supervision of other normal pregnancy, unspecified trimester: Secondary | ICD-10-CM

## 2015-06-28 LAB — PREPARE RBC (CROSSMATCH)

## 2015-06-28 SURGERY — Surgical Case
Anesthesia: Spinal

## 2015-06-28 MED ORDER — KETOROLAC TROMETHAMINE 30 MG/ML IJ SOLN: 30.0000 mg | Freq: Four times a day (QID) | INTRAMUSCULAR | Status: AC | PRN

## 2015-06-28 MED ORDER — NALBUPHINE HCL 10 MG/ML IJ SOLN: 5.0000 mg | Freq: Once | INTRAMUSCULAR | Status: DC | PRN

## 2015-06-28 MED ORDER — SIMETHICONE 80 MG PO CHEW
80.0000 mg | CHEWABLE_TABLET | ORAL | Status: DC
  Administered 2015-06-28 – 2015-07-01 (×3): 80 mg via ORAL
  Filled 2015-06-28 (×3): qty 1

## 2015-06-28 MED ORDER — IBUPROFEN 600 MG PO TABS: 600.0000 mg | ORAL_TABLET | Freq: Four times a day (QID) | ORAL | Status: DC | PRN

## 2015-06-28 MED ORDER — LACTATED RINGERS IV SOLN
Freq: Once | INTRAVENOUS | Status: AC
  Administered 2015-06-28: 07:00:00 via INTRAVENOUS

## 2015-06-28 MED ORDER — DIPHENHYDRAMINE HCL 25 MG PO CAPS
25.0000 mg | ORAL_CAPSULE | ORAL | Status: DC | PRN
  Filled 2015-06-28: qty 1

## 2015-06-28 MED ORDER — ONDANSETRON HCL 4 MG/2ML IJ SOLN
INTRAMUSCULAR | Status: DC | PRN
  Administered 2015-06-28: 4 mg via INTRAVENOUS

## 2015-06-28 MED ORDER — KETOROLAC TROMETHAMINE 30 MG/ML IJ SOLN
INTRAMUSCULAR | Status: AC
  Filled 2015-06-28: qty 1

## 2015-06-28 MED ORDER — ATROPINE SULFATE 0.4 MG/ML IJ SOLN
INTRAMUSCULAR | Status: AC
  Filled 2015-06-28: qty 2

## 2015-06-28 MED ORDER — IBUPROFEN 800 MG PO TABS
800.0000 mg | ORAL_TABLET | Freq: Three times a day (TID) | ORAL | Status: DC
  Administered 2015-06-28 – 2015-07-01 (×8): 800 mg via ORAL
  Filled 2015-06-28 (×8): qty 1

## 2015-06-28 MED ORDER — WITCH HAZEL-GLYCERIN EX PADS: 1.0000 "application " | MEDICATED_PAD | CUTANEOUS | Status: DC | PRN

## 2015-06-28 MED ORDER — DIBUCAINE 1 % RE OINT
1.0000 | TOPICAL_OINTMENT | RECTAL | Status: DC | PRN
Start: 2015-06-28 — End: 2015-07-01

## 2015-06-28 MED ORDER — LACTATED RINGERS IV SOLN
INTRAVENOUS | Status: DC
  Administered 2015-06-28: 14:00:00 via INTRAVENOUS

## 2015-06-28 MED ORDER — ONDANSETRON HCL 4 MG/2ML IJ SOLN: 4.0000 mg | Freq: Three times a day (TID) | INTRAMUSCULAR | Status: DC | PRN

## 2015-06-28 MED ORDER — NALOXONE HCL 2 MG/2ML IJ SOSY
1.0000 ug/kg/h | PREFILLED_SYRINGE | INTRAVENOUS | Status: DC | PRN
  Filled 2015-06-28: qty 2

## 2015-06-28 MED ORDER — MENTHOL 3 MG MT LOZG: 1.0000 | LOZENGE | OROMUCOSAL | Status: DC | PRN

## 2015-06-28 MED ORDER — SCOPOLAMINE 1 MG/3DAYS TD PT72
1.0000 | MEDICATED_PATCH | Freq: Once | TRANSDERMAL | Status: DC
  Administered 2015-06-28: 1.5 mg via TRANSDERMAL

## 2015-06-28 MED ORDER — PHENYLEPHRINE 8 MG IN D5W 100 ML (0.08MG/ML) PREMIX OPTIME
INJECTION | INTRAVENOUS | Status: AC
  Filled 2015-06-28: qty 100

## 2015-06-28 MED ORDER — ONDANSETRON HCL 4 MG/2ML IJ SOLN
INTRAMUSCULAR | Status: AC
  Filled 2015-06-28: qty 2

## 2015-06-28 MED ORDER — SCOPOLAMINE 1 MG/3DAYS TD PT72
MEDICATED_PATCH | TRANSDERMAL | Status: AC
  Administered 2015-06-28: 1.5 mg via TRANSDERMAL
  Filled 2015-06-28: qty 1

## 2015-06-28 MED ORDER — SIMETHICONE 80 MG PO CHEW
80.0000 mg | CHEWABLE_TABLET | Freq: Three times a day (TID) | ORAL | Status: DC
  Administered 2015-06-28 – 2015-07-01 (×9): 80 mg via ORAL
  Filled 2015-06-28 (×8): qty 1

## 2015-06-28 MED ORDER — SODIUM CHLORIDE 0.9 % IJ SOLN
INTRAMUSCULAR | Status: AC
  Filled 2015-06-28: qty 6

## 2015-06-28 MED ORDER — SENNOSIDES-DOCUSATE SODIUM 8.6-50 MG PO TABS
2.0000 | ORAL_TABLET | ORAL | Status: DC
  Administered 2015-06-28 – 2015-06-30 (×3): 2 via ORAL
  Filled 2015-06-28 (×3): qty 2

## 2015-06-28 MED ORDER — LACTATED RINGERS IV SOLN
INTRAVENOUS | Status: DC
  Administered 2015-06-28: 08:00:00 via INTRAVENOUS

## 2015-06-28 MED ORDER — BUPIVACAINE IN DEXTROSE 0.75-8.25 % IT SOLN
INTRATHECAL | Status: DC | PRN
  Administered 2015-06-28: 10.5 mg via INTRATHECAL

## 2015-06-28 MED ORDER — MORPHINE SULFATE (PF) 0.5 MG/ML IJ SOLN
INTRAMUSCULAR | Status: DC | PRN
  Administered 2015-06-28: .2 mg via INTRATHECAL

## 2015-06-28 MED ORDER — DIPHENHYDRAMINE HCL 25 MG PO CAPS
25.0000 mg | ORAL_CAPSULE | Freq: Four times a day (QID) | ORAL | Status: DC | PRN
  Filled 2015-06-28: qty 1

## 2015-06-28 MED ORDER — ACETAMINOPHEN 325 MG PO TABS: 650.0000 mg | ORAL_TABLET | ORAL | Status: DC | PRN

## 2015-06-28 MED ORDER — SCOPOLAMINE 1 MG/3DAYS TD PT72: 1.0000 | MEDICATED_PATCH | Freq: Once | TRANSDERMAL | Status: DC

## 2015-06-28 MED ORDER — ACETAMINOPHEN 500 MG PO TABS
1000.0000 mg | ORAL_TABLET | Freq: Four times a day (QID) | ORAL | Status: AC
  Administered 2015-06-28 – 2015-06-29 (×2): 1000 mg via ORAL
  Filled 2015-06-28 (×3): qty 2

## 2015-06-28 MED ORDER — MEPERIDINE HCL 25 MG/ML IJ SOLN: 6.2500 mg | INTRAMUSCULAR | Status: DC | PRN

## 2015-06-28 MED ORDER — PRENATAL MULTIVITAMIN CH
1.0000 | ORAL_TABLET | Freq: Every day | ORAL | Status: DC
  Administered 2015-06-29 – 2015-07-01 (×3): 1 via ORAL
  Filled 2015-06-28 (×3): qty 1

## 2015-06-28 MED ORDER — NALBUPHINE HCL 10 MG/ML IJ SOLN: 5.0000 mg | INTRAMUSCULAR | Status: DC | PRN

## 2015-06-28 MED ORDER — KETOROLAC TROMETHAMINE 30 MG/ML IJ SOLN: 30.0000 mg | Freq: Once | INTRAMUSCULAR | Status: DC | PRN

## 2015-06-28 MED ORDER — OXYTOCIN 10 UNIT/ML IJ SOLN
40.0000 [IU] | INTRAMUSCULAR | Status: DC | PRN
  Administered 2015-06-28: 40 [IU] via INTRAVENOUS

## 2015-06-28 MED ORDER — ZOLPIDEM TARTRATE 5 MG PO TABS: 5.0000 mg | ORAL_TABLET | Freq: Every evening | ORAL | Status: DC | PRN

## 2015-06-28 MED ORDER — FENTANYL CITRATE (PF) 100 MCG/2ML IJ SOLN
INTRAMUSCULAR | Status: DC | PRN
  Administered 2015-06-28: 12.5 ug via INTRATHECAL

## 2015-06-28 MED ORDER — OXYTOCIN 10 UNIT/ML IJ SOLN
INTRAMUSCULAR | Status: AC
  Filled 2015-06-28: qty 4

## 2015-06-28 MED ORDER — CEFAZOLIN SODIUM-DEXTROSE 2-3 GM-% IV SOLR
2.0000 g | INTRAVENOUS | Status: AC
  Administered 2015-06-28: 2 g via INTRAVENOUS

## 2015-06-28 MED ORDER — LANOLIN HYDROUS EX OINT: 1.0000 "application " | TOPICAL_OINTMENT | CUTANEOUS | Status: DC | PRN

## 2015-06-28 MED ORDER — NALOXONE HCL 0.4 MG/ML IJ SOLN: 0.4000 mg | INTRAMUSCULAR | Status: DC | PRN

## 2015-06-28 MED ORDER — SIMETHICONE 80 MG PO CHEW
80.0000 mg | CHEWABLE_TABLET | ORAL | Status: DC | PRN
  Filled 2015-06-28: qty 1

## 2015-06-28 MED ORDER — FENTANYL CITRATE (PF) 100 MCG/2ML IJ SOLN: 25.0000 ug | INTRAMUSCULAR | Status: DC | PRN

## 2015-06-28 MED ORDER — DIPHENHYDRAMINE HCL 50 MG/ML IJ SOLN: 12.5000 mg | INTRAMUSCULAR | Status: DC | PRN

## 2015-06-28 MED ORDER — CEFAZOLIN SODIUM-DEXTROSE 2-3 GM-% IV SOLR
INTRAVENOUS | Status: AC
  Filled 2015-06-28: qty 50

## 2015-06-28 MED ORDER — FENTANYL CITRATE (PF) 100 MCG/2ML IJ SOLN
INTRAMUSCULAR | Status: AC
  Filled 2015-06-28: qty 2

## 2015-06-28 MED ORDER — KETOROLAC TROMETHAMINE 30 MG/ML IJ SOLN
30.0000 mg | Freq: Four times a day (QID) | INTRAMUSCULAR | Status: AC | PRN
  Administered 2015-06-28: 30 mg via INTRAMUSCULAR

## 2015-06-28 MED ORDER — PHENYLEPHRINE 8 MG IN D5W 100 ML (0.08MG/ML) PREMIX OPTIME
INJECTION | INTRAVENOUS | Status: DC | PRN
  Administered 2015-06-28: 60 ug/min via INTRAVENOUS

## 2015-06-28 MED ORDER — LACTATED RINGERS IV SOLN: 2.5000 [IU]/h | INTRAVENOUS | Status: AC

## 2015-06-28 MED ORDER — LACTATED RINGERS IV SOLN
INTRAVENOUS | Status: DC
  Administered 2015-06-28 (×2): via INTRAVENOUS

## 2015-06-28 MED ORDER — PROMETHAZINE HCL 25 MG/ML IJ SOLN: 6.2500 mg | INTRAMUSCULAR | Status: DC | PRN

## 2015-06-28 MED ORDER — MORPHINE SULFATE (PF) 0.5 MG/ML IJ SOLN
INTRAMUSCULAR | Status: AC
  Filled 2015-06-28: qty 10

## 2015-06-28 MED ORDER — SODIUM CHLORIDE 0.9 % IJ SOLN: 3.0000 mL | INTRAMUSCULAR | Status: DC | PRN

## 2015-06-28 SURGICAL SUPPLY — 38 items
BENZOIN TINCTURE PRP APPL 2/3 (GAUZE/BANDAGES/DRESSINGS) ×3 IMPLANT
CLAMP CORD UMBIL (MISCELLANEOUS) IMPLANT
CLOSURE STERI STRIP 1/2 X4 (GAUZE/BANDAGES/DRESSINGS) ×2 IMPLANT
CLOSURE WOUND 1/2 X4 (GAUZE/BANDAGES/DRESSINGS) ×1
CLOTH BEACON ORANGE TIMEOUT ST (SAFETY) ×3 IMPLANT
CONTAINER PREFILL 10% NBF 15ML (MISCELLANEOUS) IMPLANT
DRAPE SHEET LG 3/4 BI-LAMINATE (DRAPES) IMPLANT
DRSG OPSITE POSTOP 4X10 (GAUZE/BANDAGES/DRESSINGS) ×3 IMPLANT
DURAPREP 26ML APPLICATOR (WOUND CARE) ×3 IMPLANT
ELECT REM PT RETURN 9FT ADLT (ELECTROSURGICAL) ×3
ELECTRODE REM PT RTRN 9FT ADLT (ELECTROSURGICAL) ×1 IMPLANT
EXTRACTOR VACUUM M CUP 4 TUBE (SUCTIONS) IMPLANT
EXTRACTOR VACUUM M CUP 4' TUBE (SUCTIONS)
GLOVE BIO SURGEON STRL SZ 6.5 (GLOVE) ×2 IMPLANT
GLOVE BIO SURGEONS STRL SZ 6.5 (GLOVE) ×1
GLOVE BIOGEL PI IND STRL 7.0 (GLOVE) ×1 IMPLANT
GLOVE BIOGEL PI INDICATOR 7.0 (GLOVE) ×2
GOWN STRL REUS W/TWL LRG LVL3 (GOWN DISPOSABLE) ×6 IMPLANT
KIT ABG SYR 3ML LUER SLIP (SYRINGE) IMPLANT
NEEDLE HYPO 25X5/8 SAFETYGLIDE (NEEDLE) IMPLANT
NS IRRIG 1000ML POUR BTL (IV SOLUTION) ×3 IMPLANT
PACK C SECTION WH (CUSTOM PROCEDURE TRAY) ×3 IMPLANT
PAD OB MATERNITY 4.3X12.25 (PERSONAL CARE ITEMS) ×3 IMPLANT
PENCIL SMOKE EVAC W/HOLSTER (ELECTROSURGICAL) ×3 IMPLANT
RTRCTR C-SECT PINK 25CM LRG (MISCELLANEOUS) ×3 IMPLANT
STRIP CLOSURE SKIN 1/2X4 (GAUZE/BANDAGES/DRESSINGS) ×2 IMPLANT
SUT MNCRL 0 VIOLET CTX 36 (SUTURE) ×2 IMPLANT
SUT MONOCRYL 0 CTX 36 (SUTURE) ×4
SUT PLAIN 1 NONE 54 (SUTURE) IMPLANT
SUT PLAIN 2 0 XLH (SUTURE) ×3 IMPLANT
SUT VIC AB 0 CT1 27 (SUTURE) ×6
SUT VIC AB 0 CT1 27XBRD ANBCTR (SUTURE) ×3 IMPLANT
SUT VIC AB 2-0 CT1 27 (SUTURE) ×2
SUT VIC AB 2-0 CT1 TAPERPNT 27 (SUTURE) ×1 IMPLANT
SUT VIC AB 4-0 KS 27 (SUTURE) ×3 IMPLANT
SYR BULB IRRIGATION 50ML (SYRINGE) ×3 IMPLANT
TOWEL OR 17X24 6PK STRL BLUE (TOWEL DISPOSABLE) ×3 IMPLANT
TRAY FOLEY CATH SILVER 14FR (SET/KITS/TRAYS/PACK) ×3 IMPLANT

## 2015-06-28 NOTE — Anesthesia Postprocedure Evaluation (Signed)
Anesthesia Post Note  Patient: [name]  Procedure(s) Performed: Procedure(s) (LRB): CESAREAN SECTION (N/A)  Anesthesia Post Evaluation  Last Vitals:  Filed Vitals:   06/28/15 1029 06/28/15 1130  BP: 104/60 103/63  Pulse: 70 67  Temp: 36.5 C 36.4 C  Resp: 18 18    Last Pain:  Filed Vitals:   06/28/15 1222  PainSc: 2                  Sameul Tagle

## 2015-06-28 NOTE — Op Note (Signed)
[name], [name]                ACCOUNT NO.:  [id number]  MEDICAL RECORD NO.:  [id number]  LOCATION:  [facility]                          FACILITY:  [facility]  PHYSICIAN:  Thornell Sartorius, MD        DATE OF BIRTH:  [birth year]  DATE OF PROCEDURE:  06/28/2015 DATE OF DISCHARGE:                              OPERATIVE REPORT   PREOPERATIVE DIAGNOSIS:  Intrauterine pregnancy at term, history of low- transverse cesarean section x2.  Desires repeat.  POSTOPERATIVE DIAGNOSIS:  Intrauterine pregnancy at term, history of low- transverse cesarean section x2.  Desires repeat, delivered.  PROCEDURE:  Repeat low-transverse cesarean section.  SURGEON:  Thornell Sartorius MD.  ASSISTANTLinus Orn RNFA.  ANESTHESIA:  Spinal.  EBL:  Approximately 650 mL.  URINE OUTPUT:  100 mL clear urine at the end of the procedure.  IV FLUIDS:  1900 mL.  FINDINGS:  Viable female infant at 7:53 am with Apgars of 9 at 1 minute, 9 at 5 minutes.  Weight pending at the time of dictation.  Also normal uterus, tubes, and ovaries are noted.  COMPLICATIONS:  None.  PATHOLOGY:  Placenta to Labor and delivery.  DESCRIPTION OF PROCEDURE:  After informed consent was reviewed through the interpreter with the patient and her spouse.  She was transported to the operating room, placed on the table and spinal anesthesia was then placed and found to be adequate.  She was then returned to the supine position with a leftward tilt.  Prepped and draped in the normal sterile fashion.  The catheter was sterilely placed.  A Pfannenstiel skin incision was made at the level of her previous incision, carried through the underlying layer of fascia sharply.  The fascia was incised in the midline.  The incision was extended laterally with Mayo scissors.  The superior aspect of the fascial incision was clamped elevating the rectus muscles were dissected off both bluntly and sharply.  Midline was easily identified.  The peritoneum was entered bluntly.   Peritoneal incision extended superiorly and inferiorly with good visualization of the bladder.  The Alexis skin retractor was placed carefully making sure that no bowel was entrapped.  An adhesion from the anterior uterus to the bladder was excised before the Alexis retractor was placed.  The uterus was incised in transverse fashion.  Infant was delivered from a vertex presentation.  After a minute the cord was clamped and cut. Infant was handed off to the waiting pediatric staff after the suction. Nose and mouth were suctioned.  The placenta was expressed from the uterus.  The uterus was cleared of all clot and debris.  The uterine incision was closed with 2 layers of 0 Monocryl the first of which is running locked and the 2nd an imbricating layer.  This was noted to be hemostatic.  The gutters were cleared of all clot and debris.  The uterus, tubes and ovaries were noted to be normal.  The peritoneum was reapproximated with 2-0 Vicryl in a running fashion.  The fascia was reapproximated with 0 Vicryl and overlapping in the midline.  The subcuticular adipose layer was made hemostatic with Bovie cautery and the dead space was closed with plain  gut.  The 4-0 Vicryl was used to close in a subcuticular fashion.  Benzoin and Steri-Strips were applied. Sponge, lap, and needle counts were correct x2 per the operating staff. The patient tolerated the procedure well.     Thornell Sartorius, MD     JB/MEDQ  D:  06/28/2015  T:  06/28/2015  Job:  [id number]

## 2015-06-28 NOTE — Transfer of Care (Signed)
Immediate Anesthesia Transfer of Care Note  Patient: [name]  Procedure(s) Performed: Procedure(s): CESAREAN SECTION (N/A)  Patient Location: PACU  Anesthesia Type:Spinal  Level of Consciousness: awake  Airway & Oxygen Therapy: Patient Spontanous Breathing  Post-op Assessment: Report given to RN  Post vital signs: Reviewed and stable  Last Vitals:  Filed Vitals:   06/28/15 0623  BP: 113/85  Pulse: 103  Temp: 36.9 C  Resp: 18    Complications: No apparent anesthesia complications

## 2015-06-28 NOTE — Anesthesia Procedure Notes (Signed)
Spinal Patient location during procedure: OR Start time: 06/28/2015 7:27 AM End time: 06/28/2015 7:30 AM Staffing Anesthesiologist: Lyn Hollingshead Performed by: anesthesiologist  Preanesthetic Checklist Completed: patient identified, site marked, surgical consent, pre-op evaluation, timeout performed, IV checked, risks and benefits discussed and monitors and equipment checked Spinal Block Patient position: sitting Prep: site prepped and draped and DuraPrep Patient monitoring: heart rate, cardiac monitor, continuous pulse ox and blood pressure Approach: midline Location: L3-4 Injection technique: single-shot Needle Needle type: Pencan  Needle gauge: 24 G Needle length: 9 cm Needle insertion depth: 6 cm Assessment Sensory level: T4

## 2015-06-28 NOTE — Addendum Note (Signed)
Addendum  created 06/28/15 1327 by Vernice Jefferson, CRNA   Modules edited: Clinical Notes   Clinical Notes:  File: [id number]; File: [id number]

## 2015-06-28 NOTE — Brief Op Note (Signed)
06/28/2015  8:43 AM  PATIENT:  [name]  35 y.o. female  PRE-OPERATIVE DIAGNOSIS:  Repeat C/Section  POST-OPERATIVE DIAGNOSIS:  Repeat C/Section  PROCEDURE:  Procedure(s): CESAREAN SECTION (N/A)  SURGEON:  Surgeon(s) and Role:    * Janyth Contes, MD - Primary  Gaylord Shih RNFA  ANESTHESIA:   spinal  EBL:  Total I/O In: 1900 [I.V.:1900] Out: 750 [Urine:100; Blood:650]   FINDINGS: viable female infant at 7:53am, apgars 9/9, wt P; nl uterus, tubes and ovaries  BLOOD ADMINISTERED:none  DRAINS: Urinary Catheter (Foley)   LOCAL MEDICATIONS USED:  NONE  SPECIMEN:  Source of Specimen:  Placenta  DISPOSITION OF SPECIMEN:  L&D  COUNTS:  YES  TOURNIQUET:  * No tourniquets in log *  DICTATION: .Other Dictation: Dictation Number [id number]  PLAN OF CARE: Admit to inpatient   PATIENT DISPOSITION:  PACU - hemodynamically stable.   Delay start of Pharmacological VTE agent (>24hrs) due to surgical blood loss or risk of bleeding: not applicable

## 2015-06-28 NOTE — Anesthesia Postprocedure Evaluation (Signed)
Anesthesia Post Note  Patient: [name]  Procedure(s) Performed: Procedure(s) (LRB): CESAREAN SECTION (N/A)  Patient location during evaluation: Mother Baby Anesthesia Type: Spinal Level of consciousness: awake and alert Pain management: pain level controlled Vital Signs Assessment: post-procedure vital signs reviewed and stable Respiratory status: spontaneous breathing Cardiovascular status: blood pressure returned to baseline and stable Postop Assessment: no headache, no backache, spinal receding, no signs of nausea or vomiting, adequate PO intake and patient able to bend at knees Anesthetic complications: no    Last Vitals:  Filed Vitals:   06/28/15 1029 06/28/15 1130  BP: 104/60 103/63  Pulse: 70 67  Temp: 36.5 C 36.4 C  Resp: 18 18    Last Pain:  Filed Vitals:   06/28/15 1222  PainSc: 2                  Iris Tatsch

## 2015-06-28 NOTE — Anesthesia Postprocedure Evaluation (Signed)
Anesthesia Post Note  Patient: [name]  Procedure(s) Performed: Procedure(s) (LRB): CESAREAN SECTION (N/A)  Patient location during evaluation: PACU Anesthesia Type: Spinal Level of consciousness: awake Pain management: pain level controlled Vital Signs Assessment: post-procedure vital signs reviewed and stable Respiratory status: spontaneous breathing Cardiovascular status: stable Postop Assessment: no headache, no backache, spinal receding, patient able to bend at knees and no signs of nausea or vomiting Anesthetic complications: no    Last Vitals:  Filed Vitals:   06/28/15 1004 06/28/15 1005  BP:    Pulse: 78 81  Temp:    Resp: 16 19    Last Pain: There were no vitals filed for this visit.               [place]

## 2015-06-28 NOTE — Lactation Note (Signed)
This note was copied from the chart of [place]. Lactation Consultation Note  Patient Name: [name] [id number] Date: 06/28/2015 Reason for consult: Initial assessment Baby 55 hours old. Parents are both deaf. Mom reports that she nursed 2 older children for a few months. Patient's nurse Gilmore Laroche, RN in the room and states that baby has had 2 large mucous spit-ups. Mom getting up to ambulate to bathroom for first time, so [facility] brochure given to FOB and he will give to mom when she is available. Enc parents to call out as needed.   Maternal Data    Feeding    LATCH Score/Interventions                      Lactation Tools Discussed/Used     Consult Status Consult Status: Follow-up Date: 06/28/15 Follow-up type: In-patient    Inocente Salles 06/28/2015, 2:56 PM

## 2015-06-28 NOTE — Progress Notes (Signed)
UR chart review completed.  

## 2015-06-28 NOTE — Interval H&P Note (Signed)
History and Physical Interval Note:  06/28/2015 7:09 AM  [name]  has presented today for surgery, with the diagnosis of Repeat C/Section  The various methods of treatment have been discussed with the patient and family. After consideration of risks, benefits and other options for treatment, the patient has consented to  Procedure(s): CESAREAN SECTION (N/A) as a surgical intervention .  The patient's history has been reviewed, patient examined, no change in status, stable for surgery.  I have reviewed the patient's chart and labs.  Questions were answered to the patient's satisfaction.     Bovard-Stuckert, Reed Eifert

## 2015-06-29 ENCOUNTER — Encounter (HOSPITAL_COMMUNITY): Payer: Self-pay | Admitting: Obstetrics and Gynecology

## 2015-06-29 LAB — CBC
HEMATOCRIT: 28.6 % — AB (ref 36.0–46.0)
Hemoglobin: 9.4 g/dL — ABNORMAL LOW (ref 12.0–15.0)
MCH: 30.8 pg (ref 26.0–34.0)
MCHC: 32.9 g/dL (ref 30.0–36.0)
MCV: 93.8 fL (ref 78.0–100.0)
PLATELETS: 164 10*3/uL (ref 150–400)
RBC: 3.05 MIL/uL — AB (ref 3.87–5.11)
RDW: 15.4 % (ref 11.5–15.5)
WBC: 7.4 10*3/uL (ref 4.0–10.5)

## 2015-06-29 LAB — TYPE AND SCREEN
ABO/RH(D): O POS
ANTIBODY SCREEN: NEGATIVE
UNIT DIVISION: 0
Unit division: 0

## 2015-06-29 LAB — BIRTH TISSUE RECOVERY COLLECTION (PLACENTA DONATION)

## 2015-06-29 MED ORDER — OXYCODONE-ACETAMINOPHEN 5-325 MG PO TABS
1.0000 | ORAL_TABLET | ORAL | Status: AC | PRN
  Administered 2015-06-29 – 2015-07-01 (×6): 1 via ORAL
  Filled 2015-06-29 (×7): qty 1

## 2015-06-29 NOTE — Lactation Note (Signed)
This note was copied from the chart of [name]. Lactation Consultation Note  Patient Name: [name] [id number] Date: 06/29/2015 Reason for consult: Follow-up assessment  Visited with Mom, baby 38 hrs old.  Interpretor in to assist, using sign language.  Baby cueing so offered to assist with positioning and latch.  Baby positioned on left side in football hold. Demonstrated manual breast expression and colostrum easily expressed.  Mom stated she was worried she didn't have enough milk for baby.  Baby latched with wide open gape of mouth.  Mom denied any discomfort with the feeding.  Encouraged skin to skin feedings, when baby cues to feed.  Mom trying to pull breast away for baby's nose, but explained how this can cause nipple soreness.  Reassured Mom that her colostrum is enough if she feeds baby often.  Mom denies any questions.  To follow up prn and in am.        Consult Status Consult Status: Follow-up Date: 06/30/15 Follow-up type: In-patient    Broadus John 06/29/2015, 8:59 AM

## 2015-06-29 NOTE — Progress Notes (Signed)
Subjective: Postpartum Day 1: Cesarean Delivery Patient reports incisional pain and tolerating PO.  Fundus was at umbilicus, thru day 2-3 above.  Bleeding has been stable.    Objective: Vital signs in last 24 hours: Temp:  [97.5 F (36.4 C)-98.5 F (36.9 C)] 97.5 F (36.4 C) (01/10 0505) Pulse Rate:  [65-88] 65 (01/10 0505) Resp:  [13-23] 18 (01/10 0505) BP: (92-113)/(39-81) 99/55 mmHg (01/10 0505) SpO2:  [94 %-100 %] 99 % (01/10 0505)  Physical Exam:  General: alert and no distress Lochia: appropriate Uterine Fundus: firm, below marking Incision: healing well DVT Evaluation: No evidence of DVT seen on physical exam.   Recent Labs  06/29/15 0535  HGB 9.4*  HCT 28.6*    Assessment/Plan: Status post Cesarean section. Doing well postoperatively.  Continue current care Talked thru interpreter.  Bovard-Stuckert, Elycia Woodside 06/29/2015, 8:19 AM

## 2015-06-30 NOTE — Progress Notes (Signed)
Per Dr. Melba Coon request, I contacted Interpreter services at ext. [phone] and left message requesting that sign language interpreter arrive at 0730 tomorrow, (07/01/2015) instead of 0800.

## 2015-06-30 NOTE — Progress Notes (Signed)
Subjective: Postpartum Day 2: Cesarean Delivery Patient reports incisional pain, tolerating PO and no problems voiding.    Objective: Vital signs in last 24 hours: Temp:  [98.5 F (36.9 C)-98.7 F (37.1 C)] 98.5 F (36.9 C) (01/11 0626) Pulse Rate:  [68-86] 68 (01/11 0626) Resp:  [16-18] 18 (01/11 0626) BP: (102-117)/(56-57) 102/57 mmHg (01/11 0626) SpO2:  [100 %] 100 % (01/10 1730)  Physical Exam:  General: alert and no distress Lochia: appropriate Uterine Fundus: firm Incision: healing well DVT Evaluation: No evidence of DVT seen on physical exam.   Recent Labs  06/29/15 0535  HGB 9.4*  HCT 28.6*    Assessment/Plan: Status post Cesarean section. Doing well postoperatively.  Continue current care.  Bovard-Stuckert, Kathalina Ostermann 06/30/2015, 8:11 AM

## 2015-06-30 NOTE — Lactation Note (Signed)
This note was copied from the chart of [place]. Lactation Consultation Note  Sign Language Interpreter Present. Mother eating breakfast.  Baby sleeping. Mother states she has noticed she feels her uterus contract when she is breastfeeding and she feels better now that he is getting more volume. Discussed supply and demand. Mother denies questions or soreness. Mom encouraged to feed baby 8-12 times/24 hours and with feeding cues.       Patient Name: [name] [id number] Date: 06/30/2015 Reason for consult: Follow-up assessment   Maternal Data    Feeding Feeding Type: Breast Milk Length of feed: 5 min  LATCH Score/Interventions                      Lactation Tools Discussed/Used     Consult Status Consult Status: PRN    Carlye Grippe 06/30/2015, 9:45 AM

## 2015-07-01 MED ORDER — IBUPROFEN 800 MG PO TABS: 800.0000 mg | ORAL_TABLET | Freq: Three times a day (TID) | ORAL | Status: DC

## 2015-07-01 MED ORDER — PRENATAL MULTIVITAMIN CH: 1.0000 | ORAL_TABLET | Freq: Every day | ORAL | Status: DC

## 2015-07-01 MED ORDER — OXYCODONE-ACETAMINOPHEN 5-325 MG PO TABS: 1.0000 | ORAL_TABLET | Freq: Four times a day (QID) | ORAL | Status: DC | PRN

## 2015-07-01 MED ORDER — OXYCODONE-ACETAMINOPHEN 5-325 MG PO TABS
1.0000 | ORAL_TABLET | ORAL | Status: DC | PRN
  Administered 2015-07-01: 1 via ORAL
  Administered 2015-07-01: 2 via ORAL
  Filled 2015-07-01: qty 2

## 2015-07-01 NOTE — Progress Notes (Signed)
Subjective: Postpartum Day 3: Cesarean Delivery Patient reports incisional pain and tolerating PO.    Objective: Vital signs in last 24 hours: Temp:  [98.1 F (36.7 C)-98.6 F (37 C)] 98.6 F (37 C) (01/12 0516) Pulse Rate:  [70-78] 70 (01/12 0516) Resp:  [18] 18 (01/12 0516) BP: (104-111)/(55-60) 104/55 mmHg (01/12 0516)  Physical Exam:  General: alert and no distress Lochia: appropriate Uterine Fundus: firm Incision: healing well DVT Evaluation: No evidence of DVT seen on physical exam.   Recent Labs  06/29/15 0535  HGB 9.4*  HCT 28.6*    Assessment/Plan: Status post Cesarean section. Doing well postoperatively.  Discharge home with standard precautions and return to clinic in 2 and 6 weeks. D/C with motrin, percocet and PNV,   Bovard-Stuckert, [name] 07/01/2015, 8:55 AM

## 2015-07-01 NOTE — Discharge Summary (Signed)
OB Discharge Summary     Patient Name: [name] DOB: March 08, 1981 MRN: [id number]  Date of admission: 06/28/2015 Delivering MD: Janyth Contes   Date of discharge: 07/01/2015  Admitting diagnosis: Repeat C-Section Intrauterine pregnancy: [redacted]w[redacted]d     Secondary diagnosis:  Principal Problem:   S/P cesarean section Active Problems:   Normal pregnancy, repeat  Additional problems: Hearing impaired - requires sign language interpreter     Discharge diagnosis: Term Pregnancy Delivered                                                                                                Post partum procedures:N/A  Augmentation: N/A  Complications: None  Hospital course:  Sceduled C/S   35 y.o. yo [id number] at [redacted]w[redacted]d was admitted to the hospital 06/28/2015 for scheduled cesarean section with the following indication:Elective Repeat.  Membrane Rupture Time/Date: 7:52 AM ,06/28/2015   Patient delivered a Viable infant.06/28/2015  Details of operation can be found in separate operative note.  Pateint had an uncomplicated postpartum course.  She is ambulating, tolerating a regular diet, passing flatus, and urinating well. Patient is discharged home in stable condition on  07/01/2015          Physical exam  Filed Vitals:   06/29/15 1730 06/30/15 0626 06/30/15 1757 07/01/15 0516  BP: 117/56 102/57 111/60 104/55  Pulse: 86 68 78 70  Temp: 98.7 F (37.1 C) 98.5 F (36.9 C) 98.1 F (36.7 C) 98.6 F (37 C)  TempSrc: Oral Oral Oral Oral  Resp: 16 18 18 18   SpO2: 100%      General: alert and no distress Lochia: appropriate Uterine Fundus: firm Incision: Healing well with no significant drainage DVT Evaluation: No evidence of DVT seen on physical exam. Labs: Lab Results  Component Value Date   WBC 7.4 06/29/2015   HGB 9.4* 06/29/2015   HCT 28.6* 06/29/2015   MCV 93.8 06/29/2015   PLT 164 06/29/2015   CMP Latest Ref Rng 01/10/2013  Glucose 70 - 99 mg/dL 100(H)  BUN 6 - 23 mg/dL 6   Creatinine 0.50 - 1.10 mg/dL 0.50  Sodium 135 - 145 mEq/L 133(L)  Potassium 3.5 - 5.1 mEq/L 3.5  Chloride 96 - 112 mEq/L 100  CO2 19 - 32 mEq/L 22  Calcium 8.4 - 10.5 mg/dL 9.2    Discharge instruction: per After Visit Summary and "Baby and Me Booklet".  After visit meds:    Medication List    TAKE these medications        ibuprofen 800 MG tablet  Commonly known as:  ADVIL,MOTRIN  Take 1 tablet (800 mg total) by mouth every 8 (eight) hours.     oxyCODONE-acetaminophen 5-325 MG tablet  Commonly known as:  PERCOCET/ROXICET  Take 1-2 tablets by mouth every 6 (six) hours as needed for moderate pain or severe pain.     prenatal multivitamin Tabs tablet  Take 1 tablet by mouth daily at 12 noon.        Diet: routine diet  Activity: Advance as tolerated. Pelvic rest for 6 weeks.   Outpatient follow up:2  weeks Follow up Appt:No future appointments. Follow up Visit:No Follow-up on file.  Postpartum contraception: IUD at [facility] check  Newborn Data: Live born female  Birth Weight: 8 lb 7.5 oz (3840 g) APGAR: 9, 9  Baby Feeding: Breast Disposition:home with mother   07/01/2015 Janyth Contes, MD

## 2015-07-01 NOTE — Lactation Note (Signed)
This note was copied from the chart of [place]. Lactation Consultation Note Follow up visit at 38 hours of age.  Parents are deaf and sign language interpreter available at bedside.  Mom reports breast feeding is going well.  Baby has 7 recorded feedings with LATCH scores of 10.  Baby has had 1 void in past 24 hours with 2 in past 26 and 4 stools.  Parents decline void with previous stools as they have been checking for blue line.  Advised mom to wake baby for 8-12 feedings in 24 hours and keep baby awake and active for feedings.  Mom is able to report visual swallowing due to inability to hear.  Discussed with parents normal output for days of life and referenced baby and me booklet in room.  Baby has had 4 voids in lifetime.  Mom has [place] services and plans to make appt. Soon for follow up.  Mom may get a pump from [facility] but declines need at this time.  LC gave hand pump with instructions and discussed softening breast prior to latch as milk volume increases.  Engorgement care discussed.  Encouraged frequent feedings. Mom is experienced with breastfeeding 2 older children and aware of milk volume changes.  Mom reports using a technology service to interpret sign language and is able to make calls herself as need for o/p services.  Mom is aware of services available.  No further questions at this time as mom is anticipating discharge today. Report to RN to follow up with additional LATCH to assess for transfer and baby's ability to sustain whole feeding.       Patient Name: Boy Jaycelynn Deichmann [id number] Date: 07/01/2015 Reason for consult: Follow-up assessment   Maternal Data Does the patient have breastfeeding experience prior to this delivery?: Yes  Feeding Feeding Type: Breast Fed  LATCH Score/Interventions Latch: Grasps breast easily, tongue down, lips flanged, rhythmical sucking.  Audible Swallowing: Spontaneous and intermittent  Type of Nipple: Everted at rest and after  stimulation  Comfort (Breast/Nipple): Soft / non-tender     Hold (Positioning): No assistance needed to correctly position infant at breast.  LATCH Score: 10  Lactation Tools Discussed/Used     Consult Status Consult Status: Complete    Shoptaw, Justine Null 07/01/2015, 9:40 AM

## 2015-08-11 DIAGNOSIS — Z975 Presence of (intrauterine) contraceptive device: Secondary | ICD-10-CM

## 2015-08-11 HISTORY — DX: Presence of (intrauterine) contraceptive device: Z97.5

## 2016-03-06 ENCOUNTER — Encounter: Payer: Self-pay | Admitting: Family Medicine

## 2016-03-06 ENCOUNTER — Ambulatory Visit (INDEPENDENT_AMBULATORY_CARE_PROVIDER_SITE_OTHER): Payer: Medicaid Other | Admitting: Family Medicine

## 2016-03-06 VITALS — BP 114/72 | HR 67 | Temp 98.3°F | Resp 16 | Ht 60.0 in | Wt 162.8 lb

## 2016-03-06 DIAGNOSIS — Z30431 Encounter for routine checking of intrauterine contraceptive device: Secondary | ICD-10-CM | POA: Diagnosis not present

## 2016-03-06 DIAGNOSIS — Z01419 Encounter for gynecological examination (general) (routine) without abnormal findings: Secondary | ICD-10-CM

## 2016-03-06 DIAGNOSIS — Z23 Encounter for immunization: Secondary | ICD-10-CM | POA: Diagnosis not present

## 2016-03-06 DIAGNOSIS — Z0000 Encounter for general adult medical examination without abnormal findings: Secondary | ICD-10-CM

## 2016-03-06 DIAGNOSIS — N912 Amenorrhea, unspecified: Secondary | ICD-10-CM | POA: Diagnosis not present

## 2016-03-06 DIAGNOSIS — Z124 Encounter for screening for malignant neoplasm of cervix: Secondary | ICD-10-CM | POA: Diagnosis not present

## 2016-03-06 LAB — POCT URINE PREGNANCY: PREG TEST UR: NEGATIVE

## 2016-03-06 NOTE — Progress Notes (Signed)
Name: [name]   MRN: [id number]    DOB: Jul 24, 1980   Date:03/06/2016       Progress Note  Subjective  Chief Complaint  Chief Complaint  Patient presents with  . Annual Exam    HPI  Well Woman: she is deaf and we did not have an interpreter, all questions written down or shown on computer screen.  She denies vagina discharge. Sexually active with husband. LMP June - she has an IUD and does not have regular cycles   Patient Active Problem List   Diagnosis Date Noted  . Normal pregnancy, repeat 06/27/2015  . S/P cesarean section 01/13/2013  . Bilateral deafness 01/12/2013  . Pregnancy test negative 03/22/2012    Past Surgical History:  Procedure Laterality Date  . CESAREAN SECTION    . CESAREAN SECTION N/A 01/13/2013   Procedure: REPEAT CESAREAN SECTION;  Surgeon: Thornell Sartorius, MD;  Location: [facility];  Service: Obstetrics;  Laterality: N/A;  Jamie from office called and states pt is deaf and needs interpreter on day of surgery.  EDA Royal called and intrepreter scheduled for day of surgery.  . CESAREAN SECTION N/A 06/28/2015   Procedure: CESAREAN SECTION;  Surgeon: Janyth Contes, MD;  Location: [facility];  Service: Obstetrics;  Laterality: N/A;  . WISDOM TOOTH EXTRACTION      Family History  Problem Relation Age of Onset  . Cancer Mother   . Cancer Other   . Hypertension Other     Social History   Social History  . Marital status: Married    Spouse name: N/A  . Number of children: N/A  . Years of education: N/A   Occupational History  . Not on file.   Social History Main Topics  . Smoking status: Never Smoker  . Smokeless tobacco: Never Used  . Alcohol use No  . Drug use: No  . Sexual activity: Yes    Partners: Male    Birth control/ protection: IUD   Other Topics Concern  . Not on file   Social History Narrative  . No narrative on file     Current Outpatient Prescriptions:  .  levonorgestrel (MIRENA) 20 MCG/24HR IUD, 1 each by Intrauterine  route once., Disp: , Rfl:  .  ibuprofen (ADVIL,MOTRIN) 800 MG tablet, Take 1 tablet (800 mg total) by mouth every 8 (eight) hours. (Patient not taking: Reported on 03/06/2016), Disp: 45 tablet, Rfl: 1  No Known Allergies   ROS  Constitutional: Negative for fever  yes weight change Respiratory: Negative for cough and shortness of breath.   Cardiovascular: Negative for chest pain or palpitations.  Gastrointestinal: Negative for abdominal pain, no bowel changes.  Musculoskeletal: Negative for gait problem or joint swelling.  Skin: Negative for rash.  Neurological: Negative for dizziness or headache.  No other specific complaints in a complete review of systems (except as listed in HPI above).  Objective  Vitals:   03/06/16 1118  BP: 114/72  Pulse: 67  Resp: 16  Temp: 98.3 F (36.8 C)  TempSrc: Oral  SpO2: 97%  Weight: 162 lb 12.8 oz (73.8 kg)  Height: 5' (1.524 m)    Body mass index is 31.79 kg/m.  Physical Exam  Constitutional: Patient appears well-developed and well-nourished. No distress.  HENT: Head: Normocephalic and atraumatic. Ears: B TMs ok, no erythema or effusion; Nose: Nose normal. Mouth/Throat: Oropharynx is clear and moist. No oropharyngeal exudate.  Eyes: Conjunctivae and EOM are normal. Pupils are equal, round, and reactive to light. No  scleral icterus.  Neck: Normal range of motion. Neck supple. No JVD present. No thyromegaly present.  Cardiovascular: Normal rate, regular rhythm and normal heart sounds.  No murmur heard. No BLE edema. Pulmonary/Chest: Effort normal and breath sounds normal. No respiratory distress. Abdominal: Soft. Bowel sounds are normal, no distension. There is no tenderness. no masses Breast: no lumps or masses, no nipple discharge or rashes FEMALE GENITALIA:  External genitalia normal External urethra normal Vaginal vault normal without discharge or lesions Cervix normal without discharge or lesions - IUD strings not seen Bimanual  exam normal without masses RECTAL: not done Musculoskeletal: Normal range of motion, no joint effusions. No gross deformities Neurological: he is alert and oriented to person, place, and time. No cranial nerve deficit. Coordination, balance, strength, speech and gait are normal.  Skin: Skin is warm and dry. No rash noted. No erythema.  Psychiatric: Patient has a normal mood and affect. behavior is normal. Judgment and thought content normal.    PHQ2/9: Depression screen PHQ 2/9 03/06/2016  Decreased Interest 0  Down, Depressed, Hopeless 0  PHQ - 2 Score 0     Fall Risk: Fall Risk  03/06/2016  Falls in the past year? No     Functional Status Survey: Is the patient deaf or have difficulty hearing?: Yes Does the patient have difficulty seeing, even when wearing glasses/contacts?: No Does the patient have difficulty concentrating, remembering, or making decisions?: No Does the patient have difficulty walking or climbing stairs?: No Does the patient have difficulty dressing or bathing?: No Does the patient have difficulty doing errands alone such as visiting a doctor's office or shopping?: No    Assessment & Plan  1. Well woman exam  Discussed importance of 150 minutes of physical activity weekly, eat two servings of fish weekly, eat one serving of tree nuts ( cashews, pistachios, pecans, almonds.Marland Kitchen) every other day, eat 6 servings of fruit/vegetables daily and drink plenty of water and avoid sweet beverages.   2. Needs flu shot  - Flu Vaccine QUAD 36+ mos PF IM (Fluarix & Fluzone Quad PF)  3. Amenorrhea  - US Pelvis Limited; Future - POCT urine pregnancy  4. IUD check up  - US Pelvis Limited; Future  5. Cervical cancer screening  - Pap IG, CT/NG NAA, and HPV (high risk)

## 2016-03-06 NOTE — Patient Instructions (Signed)
Please make sure you have at least  150 minutes of physical activity weekly, eat two servings of fish weekly, eat one serving of tree nuts ( cashews, pistachios, pecans, almonds.Marland Kitchen) every other day, eat 6 servings of fruit/vegetables daily and drink plenty of water and avoid sweet beverages.

## 2016-03-07 NOTE — Addendum Note (Signed)
Addended by: Inda Coke on: 03/07/2016 08:58 AM   Modules accepted: Orders

## 2016-03-10 ENCOUNTER — Other Ambulatory Visit: Payer: Self-pay | Admitting: Emergency Medicine

## 2016-03-11 LAB — PAP IG, CT-NG NAA, HPV HIGH-RISK
CHLAMYDIA, NUC. ACID AMP: NEGATIVE
Gonococcus by Nucleic Acid Amp: NEGATIVE
HPV, high-risk: NEGATIVE
PAP SMEAR COMMENT: 0

## 2016-03-13 ENCOUNTER — Other Ambulatory Visit: Payer: Self-pay | Admitting: Family Medicine

## 2016-03-13 ENCOUNTER — Encounter: Payer: Self-pay | Admitting: Emergency Medicine

## 2016-03-13 ENCOUNTER — Ambulatory Visit: Payer: Medicaid Other

## 2016-03-13 DIAGNOSIS — Z30431 Encounter for routine checking of intrauterine contraceptive device: Secondary | ICD-10-CM

## 2016-04-06 ENCOUNTER — Encounter: Payer: Medicaid Other | Admitting: Obstetrics and Gynecology

## 2017-02-11 ENCOUNTER — Ambulatory Visit (HOSPITAL_COMMUNITY)
Admission: EM | Admit: 2017-02-11 | Discharge: 2017-02-11 | Disposition: A | Discharge status: Home / Self Care | Payer: Medicaid Other | Attending: Internal Medicine | Admitting: Internal Medicine

## 2017-02-11 ENCOUNTER — Encounter (HOSPITAL_COMMUNITY): Payer: Self-pay | Admitting: Emergency Medicine

## 2017-02-11 DIAGNOSIS — S39012A Strain of muscle, fascia and tendon of lower back, initial encounter: Secondary | ICD-10-CM

## 2017-02-11 DIAGNOSIS — M545 Low back pain: Secondary | ICD-10-CM

## 2017-02-11 LAB — POCT URINALYSIS DIP (DEVICE)
Bilirubin Urine: NEGATIVE
Glucose, UA: NEGATIVE mg/dL
HGB URINE DIPSTICK: NEGATIVE
KETONES UR: NEGATIVE mg/dL
Leukocytes, UA: NEGATIVE
Nitrite: NEGATIVE
PH: 6.5 (ref 5.0–8.0)
PROTEIN: NEGATIVE mg/dL
UROBILINOGEN UA: 0.2 mg/dL (ref 0.0–1.0)

## 2017-02-11 MED ORDER — NAPROXEN 500 MG PO TABS: 500.0000 mg | ORAL_TABLET | Freq: Two times a day (BID) | ORAL | 0 refills | Status: AC

## 2017-02-11 NOTE — Discharge Instructions (Signed)
Urine negative for UTI. Exam consistent with muscle strain. Take naproxen as directed. Ice/heat compress as needed. This can take up to 3-4 weeks to completely resolve, but you should be feeling better each week. If experiencing worsening of symptoms, numbness/tingling, follow up here or with PCP for reevaluation.

## 2017-02-11 NOTE — ED Triage Notes (Signed)
The [facility] with a [facility] of left flank pain that started this am. The patient denied any urinary symptoms.

## 2017-02-11 NOTE — ED Provider Notes (Signed)
[facility]    CSN: [id number] Arrival date & time: 02/11/17  1454     History   Chief Complaint Chief Complaint  Patient presents with  . Flank Pain    HPI [name] is a 36 y.o. female.   36 year old female comes in with mother for a one-day history of left flank pain. Patient is deaf, mother acts as Optometrist. Patient states pain is at the left lower back, with sudden onset. The pain comes and goes, and is exacerbated by movement. Patient has not taken anything for the pain. Mother states patient has 3 sons, so is very active during the day. She has been picking up her 2 younger sons, who are 75 year old, and 20-year-old. Denies any urinary symptoms such as frequency, dysuria, hematuria. Denies vaginal symptoms such as discharge, itching/pain, spotting. Denies fever, chills, night sweats. Denies chest pain, shortness of breath, weakness, dizziness, headache.      Past Medical History:  Diagnosis Date  . Deafness   . Gastroesophageal reflux in pregancy   . GERD (gastroesophageal reflux disease)   . Normal pregnancy, repeat 01/12/2013  . Normal pregnancy, repeat 06/27/2015  . PONV (postoperative nausea and vomiting)   . S/P cesarean section 01/13/2013    Patient Active Problem List   Diagnosis Date Noted  . S/P cesarean section 01/13/2013  . Bilateral deafness 01/12/2013  . Pregnancy test negative 03/22/2012    Past Surgical History:  Procedure Laterality Date  . CESAREAN SECTION    . CESAREAN SECTION N/A 01/13/2013   Procedure: REPEAT CESAREAN SECTION;  Surgeon: Thornell Sartorius, MD;  Location: [facility];  Service: Obstetrics;  Laterality: N/A;  Jamie from office called and states pt is deaf and needs interpreter on day of surgery.  EDA Royal called and intrepreter scheduled for day of surgery.  . CESAREAN SECTION N/A 06/28/2015   Procedure: CESAREAN SECTION;  Surgeon: Janyth Contes, MD;  Location: [facility];  Service: Obstetrics;  Laterality: N/A;  . WISDOM  TOOTH EXTRACTION      OB History    Gravida Para Term Preterm AB Living   3 3 3     2    SAB TAB Ectopic Multiple Live Births         0 2       Home Medications    Prior to Admission medications   Medication Sig Start Date End Date Taking? Authorizing Provider  levonorgestrel (MIRENA) 20 MCG/24HR IUD 1 each by Intrauterine route once.    [provider]  naproxen (NAPROSYN) 500 MG tablet Take 1 tablet (500 mg total) by mouth 2 (two) times daily. 02/11/17 02/21/17  Ok Edwards, PA-C    Family History Family History  Problem Relation Age of Onset  . Cancer Mother   . Cancer Other   . Hypertension Other     Social History Social History  Substance Use Topics  . Smoking status: Never Smoker  . Smokeless tobacco: Never Used  . Alcohol use No     Allergies   Patient has no known allergies.   Review of Systems Review of Systems   Physical Exam Triage Vital Signs ED Triage Vitals  Enc Vitals Group     BP 02/11/17 1516 (!) 95/41     Pulse Rate 02/11/17 1516 (!) 59     Resp 02/11/17 1516 18     Temp 02/11/17 1516 98.3 F (36.8 C)     Temp Source 02/11/17 1516 Oral  SpO2 02/11/17 1516 99 %     Weight --      Height --      Head Circumference --      Peak Flow --      Pain Score 02/11/17 1514 3     Pain Loc --      Pain Edu? --      Excl. in [place]? --    No data found.   Updated Vital Signs BP (!) 95/41 (BP Location: Right Arm)   Pulse (!) 59   Temp 98.3 F (36.8 C) (Oral)   Resp 18   SpO2 99%       Physical Exam  Constitutional: She is oriented to person, place, and time. She appears well-developed and well-nourished. No distress.  HENT:  Head: Normocephalic and atraumatic.  Eyes: Pupils are equal, round, and reactive to light. Conjunctivae are normal.  Cardiovascular: Normal rate, regular rhythm and normal heart sounds.  Exam reveals no gallop and no friction rub.   No murmur heard. Pulmonary/Chest: Effort normal and breath sounds normal.  She has no wheezes. She has no rales.  Abdominal: Soft. Bowel sounds are normal. She exhibits no mass. There is no tenderness. There is no rebound, no guarding and no CVA tenderness.  Musculoskeletal:  No tenderness on palpation of midline, bilateral back. No tenderness on palpation of hips. Patient states pain was reproduced as she laid down. Full range of motion of back and hips, strength normal and equal bilaterally. Sensation intact and equal bilaterally. Negative straight leg raise.  Neurological: She is alert and oriented to person, place, and time.     UC Treatments / Results  Labs (all labs ordered are listed, but only abnormal results are displayed) Labs Reviewed  POCT URINALYSIS DIP (DEVICE)    EKG  EKG Interpretation None       Radiology No results found.  Procedures Procedures (including critical care time)  Medications Ordered in UC Medications - No data to display   Initial Impression / Assessment and Plan / UC Course  I have reviewed the triage vital signs and the nursing notes.  Pertinent labs & imaging results that were available during my care of the patient were reviewed by me and considered in my medical decision making (see chart for details).    Urine negative for UTI. History and exam most consistent with muscle strain. Start NSAID as directed for pain and inflammation.Ice/heat compresses. Discussed with patient strain can take up to 2-3 weeks to resolve, but should be getting better each week. Return precautions given.    Final Clinical Impressions(s) / UC Diagnoses   Final diagnoses:  Strain of lumbar region, initial encounter    New Prescriptions Discharge Medication List as of 02/11/2017  4:12 PM    START taking these medications   Details  naproxen (NAPROSYN) 500 MG tablet Take 1 tablet (500 mg total) by mouth 2 (two) times daily., Starting Sun 02/11/2017, Until Wed 02/21/2017, Normal          Sahmir Weatherbee V, PA-C 02/11/17 2132

## 2017-04-25 DIAGNOSIS — Z79899 Other long term (current) drug therapy: Secondary | ICD-10-CM | POA: Diagnosis not present

## 2017-04-25 DIAGNOSIS — L700 Acne vulgaris: Secondary | ICD-10-CM | POA: Diagnosis not present

## 2017-05-03 ENCOUNTER — Encounter: Payer: Self-pay | Admitting: Family Medicine

## 2017-05-03 ENCOUNTER — Ambulatory Visit (INDEPENDENT_AMBULATORY_CARE_PROVIDER_SITE_OTHER): Payer: Medicare Other | Admitting: Family Medicine

## 2017-05-03 VITALS — BP 118/64 | HR 71 | Temp 97.5°F | Resp 16 | Ht 60.0 in | Wt 154.4 lb

## 2017-05-03 DIAGNOSIS — Z803 Family history of malignant neoplasm of breast: Secondary | ICD-10-CM

## 2017-05-03 DIAGNOSIS — Z6830 Body mass index (BMI) 30.0-30.9, adult: Secondary | ICD-10-CM

## 2017-05-03 DIAGNOSIS — Z23 Encounter for immunization: Secondary | ICD-10-CM

## 2017-05-03 DIAGNOSIS — Z1239 Encounter for other screening for malignant neoplasm of breast: Secondary | ICD-10-CM

## 2017-05-03 DIAGNOSIS — E669 Obesity, unspecified: Secondary | ICD-10-CM

## 2017-05-03 NOTE — Progress Notes (Signed)
Name: [name]   MRN: [id number]    DOB: 19-Mar-1981   Date:05/03/2017       Progress Note  Subjective  Chief Complaint  Chief Complaint  Patient presents with  . Mammogram    Mother has had breast cancer twice and would like to be checked for breast cancer    She had a interpreter with her today Social worker)   HPI  Family history of breast cancer: she came in for breast cancer screening, she is concerned because her mother was diagnosed with breast cancer and also her maternal aunt and paternal grandmother. She does not have any breast lumps or nipple discharge. No change in the skin of her breast.   Obesity: she has a BMI above 30, she states she started to eat hard boiled egg and is active, she will try to lose weight but following a healthy diet.   Patient Active Problem List   Diagnosis Date Noted  . S/P cesarean section 01/13/2013  . Bilateral deafness 01/12/2013    Past Surgical History:  Procedure Laterality Date  . CESAREAN SECTION    . CESAREAN SECTION N/A 01/13/2013   Procedure: REPEAT CESAREAN SECTION;  Surgeon: Thornell Sartorius, MD;  Location: [facility];  Service: Obstetrics;  Laterality: N/A;  Jamie from office called and states pt is deaf and needs interpreter on day of surgery.  EDA Royal called and intrepreter scheduled for day of surgery.  . CESAREAN SECTION N/A 06/28/2015   Procedure: CESAREAN SECTION;  Surgeon: Janyth Contes, MD;  Location: [facility];  Service: Obstetrics;  Laterality: N/A;  . WISDOM TOOTH EXTRACTION      Family History  Problem Relation Age of Onset  . Breast cancer Mother   . Diabetes Father   . Hypertension Brother   . Breast cancer Paternal Grandmother   . Breast cancer Maternal Aunt   . Heart attack Paternal Aunt   . Prostate cancer Paternal Uncle     Social History   Socioeconomic History  . Marital status: Married    Spouse name: Not on file  . Number of children: 3  . Years of education: Not on file  . Highest  education level: Not on file  Social Needs  . Financial resource strain: Not on file  . Food insecurity - worry: Not on file  . Food insecurity - inability: Not on file  . Transportation needs - medical: Not on file  . Transportation needs - non-medical: Not on file  Occupational History  . Not on file  Tobacco Use  . Smoking status: Never Smoker  . Smokeless tobacco: Never Used  Substance and Sexual Activity  . Alcohol use: Yes    Comment: social drinker  . Drug use: No  . Sexual activity: Yes    Partners: Male    Birth control/protection: IUD  Other Topics Concern  . Not on file  Social History Narrative   Married with 3 children, stay at home with her children     Current Outpatient Medications:  .  doxycycline (MONODOX) 50 MG capsule, Take 50 mg daily by mouth., Disp: , Rfl:  .  levonorgestrel (MIRENA) 20 MCG/24HR IUD, 1 each by Intrauterine route once., Disp: , Rfl:   Allergies  Allergen Reactions  . Nickel Rash     ROS  Ten systems reviewed and is negative except as mentioned in HPI   Objective  Vitals:   05/03/17 0916  BP: 118/64  Pulse: 71  Resp: 16  Temp: (!) 97.5 F (36.4 C)  TempSrc: Oral  SpO2: 98%  Weight: 154 lb 6.4 oz (70 kg)  Height: 5' (1.524 m)    Body mass index is 30.15 kg/m.  Physical Exam  Constitutional: Patient appears well-developed and well-nourished. Obese  No distress.  HEENT: head atraumatic, normocephalic, pupils equal and reactive to light, neck supple, throat within normal limits Cardiovascular: Normal rate, regular rhythm and normal heart sounds.  No murmur heard. No BLE edema Breast: lumpy breast, no masses, no nipple discharge Pulmonary/Chest: Effort normal and breath sounds normal. No respiratory distress. Abdominal: Soft.  There is no tenderness. Psychiatric: Patient has a normal mood and affect. behavior is normal. Judgment and thought content normal.  Recent Results (from the past 2160 hour(s))  POCT  urinalysis dip (device)     Status: None   Collection Time: 02/11/17  3:52 PM  Result Value Ref Range   Glucose, UA NEGATIVE NEGATIVE mg/dL   Bilirubin Urine NEGATIVE NEGATIVE   Ketones, ur NEGATIVE NEGATIVE mg/dL   Specific Gravity, Urine >=1.030 1.005 - 1.030   Hgb urine dipstick NEGATIVE NEGATIVE   pH 6.5 5.0 - 8.0   Protein, ur NEGATIVE NEGATIVE mg/dL   Urobilinogen, UA 0.2 0.0 - 1.0 mg/dL   Nitrite NEGATIVE NEGATIVE   Leukocytes, UA NEGATIVE NEGATIVE    Comment: Biochemical Testing Only. Please order routine urinalysis from main lab if confirmatory testing is needed.     PHQ2/9: Depression screen [facility] 2/9 05/03/2017 03/06/2016  Decreased Interest 3 0  Down, Depressed, Hopeless 1 0  PHQ - 2 Score 4 0  Altered sleeping 0 -  Tired, decreased energy 0 -  Change in appetite 0 -  Feeling bad or failure about yourself  0 -  Trouble concentrating 0 -  Moving slowly or fidgety/restless 0 -  Suicidal thoughts 0 -  PHQ-9 Score 4 -  Difficult doing work/chores Not difficult at all -     Fall Risk: Fall Risk  05/03/2017 03/06/2016  Falls in the past year? No No     Functional Status Survey: Is the patient deaf or have difficulty hearing?: Yes(Patient is deaf) Does the patient have difficulty seeing, even when wearing glasses/contacts?: No Does the patient have difficulty concentrating, remembering, or making decisions?: No Does the patient have difficulty walking or climbing stairs?: No Does the patient have difficulty dressing or bathing?: No Does the patient have difficulty doing errands alone such as visiting a doctor's office or shopping?: No    Assessment & Plan  1. Family history of breast cancer  - Integrated [employer] ([place]) Discussed the risk and benefits with patient she is aware of the fact that she will follow with counselor if positive.   2. Need for immunization against influenza  - Flu Vaccine QUAD 6+ mos PF IM (Fluarix Quad  PF)  3. Obesity (BMI 30.0-34.9)  Discussed with the patient the risk posed by an increased BMI. Discussed importance of portion control, calorie counting and at least 150 minutes of physical activity weekly. Avoid sweet beverages and drink more water. Eat at least 6 servings of fruit and vegetables daily

## 2017-07-04 ENCOUNTER — Ambulatory Visit (INDEPENDENT_AMBULATORY_CARE_PROVIDER_SITE_OTHER): Payer: Medicare Other | Admitting: Family Medicine

## 2017-07-04 ENCOUNTER — Encounter: Payer: Self-pay | Admitting: Family Medicine

## 2017-07-04 VITALS — BP 100/58 | HR 85 | Temp 98.0°F | Resp 12 | Ht 60.0 in | Wt 158.6 lb

## 2017-07-04 DIAGNOSIS — Z0000 Encounter for general adult medical examination without abnormal findings: Secondary | ICD-10-CM

## 2017-07-04 DIAGNOSIS — Z803 Family history of malignant neoplasm of breast: Secondary | ICD-10-CM

## 2017-07-04 DIAGNOSIS — Z130 Encounter for screening for diseases of the blood and blood-forming organs and certain disorders involving the immune mechanism: Secondary | ICD-10-CM | POA: Diagnosis not present

## 2017-07-04 DIAGNOSIS — Z131 Encounter for screening for diabetes mellitus: Secondary | ICD-10-CM | POA: Diagnosis not present

## 2017-07-04 DIAGNOSIS — Z79899 Other long term (current) drug therapy: Secondary | ICD-10-CM | POA: Diagnosis not present

## 2017-07-04 DIAGNOSIS — Z13220 Encounter for screening for lipoid disorders: Secondary | ICD-10-CM | POA: Diagnosis not present

## 2017-07-04 DIAGNOSIS — E669 Obesity, unspecified: Secondary | ICD-10-CM | POA: Diagnosis not present

## 2017-07-04 DIAGNOSIS — Z6830 Body mass index (BMI) 30.0-30.9, adult: Secondary | ICD-10-CM | POA: Diagnosis not present

## 2017-07-04 DIAGNOSIS — E66811 Obesity, class 1: Secondary | ICD-10-CM

## 2017-07-04 DIAGNOSIS — Z1231 Encounter for screening mammogram for malignant neoplasm of breast: Secondary | ICD-10-CM | POA: Diagnosis not present

## 2017-07-04 LAB — COMPLETE METABOLIC PANEL WITH GFR
AG RATIO: 1.5 (calc) (ref 1.0–2.5)
ALT: 11 U/L (ref 6–29)
AST: 15 U/L (ref 10–30)
Albumin: 4.3 g/dL (ref 3.6–5.1)
Alkaline phosphatase (APISO): 50 U/L (ref 33–115)
BUN: 13 mg/dL (ref 7–25)
CALCIUM: 9.4 mg/dL (ref 8.6–10.2)
CO2: 29 mmol/L (ref 20–32)
CREATININE: 0.67 mg/dL (ref 0.50–1.10)
Chloride: 102 mmol/L (ref 98–110)
GFR, EST AFRICAN AMERICAN: 133 mL/min/{1.73_m2} (ref >=60)
GFR, EST NON AFRICAN AMERICAN: 115 mL/min/{1.73_m2} (ref >=60)
Globulin: 2.8 g/dL (calc) (ref 1.9–3.7)
Glucose, Bld: 83 mg/dL (ref 65–99)
Potassium: 3.9 mmol/L (ref 3.5–5.3)
Sodium: 137 mmol/L (ref 135–146)
TOTAL PROTEIN: 7.1 g/dL (ref 6.1–8.1)
Total Bilirubin: 0.3 mg/dL (ref 0.2–1.2)

## 2017-07-04 LAB — CBC WITH DIFFERENTIAL/PLATELET
BASOS PCT: 0.5 %
Basophils Absolute: 38 cells/uL (ref 0–200)
EOS PCT: 2.5 %
Eosinophils Absolute: 190 cells/uL (ref 15–500)
HCT: 40.2 % (ref 35.0–45.0)
Hemoglobin: 13.5 g/dL (ref 11.7–15.5)
LYMPHS ABS: 2288 {cells}/uL (ref 850–3900)
MCH: 30.8 pg (ref 27.0–33.0)
MCHC: 33.6 g/dL (ref 32.0–36.0)
MCV: 91.8 fL (ref 80.0–100.0)
MPV: 10.4 fL (ref 7.5–12.5)
Monocytes Relative: 10.3 %
NEUTROS PCT: 56.6 %
Neutro Abs: 4302 cells/uL (ref 1500–7800)
Platelets: 255 10*3/uL (ref 140–400)
RBC: 4.38 10*6/uL (ref 3.80–5.10)
RDW: 11.6 % (ref 11.0–15.0)
Total Lymphocyte: 30.1 %
WBC: 7.6 10*3/uL (ref 3.8–10.8)
WBCMIX: 783 {cells}/uL (ref 200–950)

## 2017-07-04 NOTE — Progress Notes (Signed)
Patient: [name], Female    DOB: 09-Jun-1981, 37 y.o.   MRN: [id number]  Visit Date: 07/04/2017  Today's Provider: Loistine Chance, MD   Chief Complaint  Patient presents with  . Annual Exam    Subjective:    HPI [name] is a 37 y.o. female who presents today for her Subsequent Annual Wellness Visit.  Patient/Caregiver input:    Review of Systems  Constitutional: Negative for fever or weight change.  Respiratory: Negative for cough and shortness of breath.   Cardiovascular: Negative for chest pain or palpitations.  Gastrointestinal: Negative for abdominal pain, no bowel changes.  Musculoskeletal: Negative for gait problem or joint swelling.  Skin: Negative for rash.  Neurological: Negative for dizziness or headache.  No other specific complaints in a complete review of systems (except as listed in HPI above).  Past Medical History:  Diagnosis Date  . Deafness   . Gastroesophageal reflux in pregancy   . GERD (gastroesophageal reflux disease)   . IUD (intrauterine device) in place 08/11/2015  . Normal pregnancy, repeat 01/12/2013  . Normal pregnancy, repeat 06/27/2015  . PONV (postoperative nausea and vomiting)   . S/P cesarean section 01/13/2013    Past Surgical History:  Procedure Laterality Date  . CESAREAN SECTION    . CESAREAN SECTION N/A 01/13/2013   Procedure: REPEAT CESAREAN SECTION;  Surgeon: Thornell Sartorius, MD;  Location: [facility];  Service: Obstetrics;  Laterality: N/A;  Jamie from office called and states pt is deaf and needs interpreter on day of surgery.  EDA Royal called and intrepreter scheduled for day of surgery.  . CESAREAN SECTION N/A 06/28/2015   Procedure: CESAREAN SECTION;  Surgeon: Janyth Contes, MD;  Location: [facility];  Service: Obstetrics;  Laterality: N/A;  . WISDOM TOOTH EXTRACTION      Family History  Problem Relation Age of Onset  . Breast cancer Mother   . Diabetes Father   . Hypertension Brother   . Breast cancer Paternal  Grandmother   . Breast cancer Maternal Aunt   . Heart attack Paternal Aunt   . Prostate cancer Paternal Uncle     Social History   Socioeconomic History  . Marital status: Married    Spouse name: Not on file  . Number of children: 3  . Years of education: Not on file  . Highest education level: Associate degree: academic program  Social Needs  . Financial resource strain: Hard  . Food insecurity - worry: Often true  . Food insecurity - inability: Sometimes true  . Transportation needs - medical: No  . Transportation needs - non-medical: No  Occupational History  . Occupation: disabled     Comment: hearing  Tobacco Use  . Smoking status: Never Smoker  . Smokeless tobacco: Never Used  Substance and Sexual Activity  . Alcohol use: Yes    Comment: social drinker  . Drug use: No  . Sexual activity: Yes    Partners: Male    Birth control/protection: IUD  Other Topics Concern  . Not on file  Social History Narrative   Married with 3 children, stay at home with her children    Outpatient Encounter Medications as of 07/04/2017  Medication Sig  . doxycycline (MONODOX) 50 MG capsule Take 50 mg daily by mouth.  . levonorgestrel (MIRENA) 20 MCG/24HR IUD 1 each by Intrauterine route once.   No facility-administered encounter medications on file as of 07/04/2017.     Allergies  Allergen Reactions  . Nickel Rash  Care Team Updated in EHR: Yes  Last Vision Exam: not sure. Wears corrective lenses: No Last Dental Exam: not up to date Last Hearing Exam: cannot hear,  Wears Hearing Aids: No  Functional Ability / Safety Screening 1.  Was the timed Get Up and Go test shorter than 30 seconds?  yes 2.  Does the patient need help with the phone, transportation, shopping,      preparing meals, housework, laundry, medications, or managing money?  no she has a phone that is adapted for patients with hearing loss 3.  Is the patient's home free of loose throw rugs in walkways, pet  beds, electrical cords, etc?   she has young children with small toys      Grab bars in the bathroom? no      Handrails on the stairs?   yes      Adequate lighting?   yes 4.  Has the patient noticed any hearing difficulties?   yes - she is death     Advanced Care Planning: A voluntary discussion about advance care planning including the explanation and discussion of advance directives.  Discussed health care proxy and Living will, and the patient was able to identify a health care proxy as husband and parents   Patient does not have a living will at present time. Does patient have a HCPOA?    no If yes, name and contact information: N/A Does patient have a living will or MOST form?  no  Cancer Screenings: Skin: sees Dermatologist  Lung:  Low Dose CT Chest recommended if Age 70-80 years, 30 pack-year currently smoking OR have quit w/in 15years. Patient does not qualify. Breast:  Up to date on Mammogram? We will order it  Up to date of Bone Density/Dexa? Not applicable Colon: N/A  Additional Screenings:  Hepatitis B/HIV/Syphillis:  Not covered by insurance  Hepatitis C Screening: N/A Intimate Partner Violence: negative screen   Objective:   Vitals: BP (!) 100/58 (BP Location: Right Arm, Patient Position: Sitting, Cuff Size: Normal)   Pulse 85   Temp 98 F (36.7 C) (Oral)   Resp 12   Ht 5' (1.524 m)   Wt 158 lb 9.6 oz (71.9 kg)   SpO2 99%   BMI 30.97 kg/m  Body mass index is 30.97 kg/m.  No exam data present  Physical Exam Constitutional: Patient appears well-developed and well-nourished. Obese No distress.  HEENT: head atraumatic, normocephalic, pupils equal and reactive to light, ears normal TM bilaterally, neck supple, throat within normal limits Cardiovascular: Normal rate, regular rhythm and normal heart sounds.  No murmur heard. No BLE edema. Breast: lumpy  Pulmonary/Chest: Effort normal and breath sounds normal. No respiratory distress. Abdominal: Soft.  There is no  tenderness. Psychiatric: Patient has a normal mood and affect. behavior is normal. Judgment and thought content normal.  Cognitive Testing - 6-CIT  Correct? Score   What year is it? yes 0 Yes = 0    No = 4  What month is it? yes 0 Yes = 0    No = 3  Remember:     Pia [address]au, [place], [place]     What time is it? yes 0 Yes = 0    No = 3  Count backwards from 20 to 1 yes 0 Correct = 0    1 error = 2   More than 1 error = 4  Say the months of the year in reverse. yes 0 Correct = 0  1 error = 2   More than 1 error = 4  What address did I ask you to remember? yes 0 Correct = 0  1 error = 2    2 error = 4    3 error = 6    4 error = 8    All wrong = 10       TOTAL SCORE  0/28   Interpretation:  Normal  Normal (0-7) Abnormal (8-28)   Fall Risk: Fall Risk  07/04/2017 05/03/2017 03/06/2016  Falls in the past year? No No No    Depression Screen Depression screen [facility] 2/9 07/04/2017 05/03/2017 03/06/2016  Decreased Interest 0 3 0  Down, Depressed, Hopeless 0 1 0  PHQ - 2 Score 0 4 0  Altered sleeping - 0 -  Tired, decreased energy - 0 -  Change in appetite - 0 -  Feeling bad or failure about yourself  - 0 -  Trouble concentrating - 0 -  Moving slowly or fidgety/restless - 0 -  Suicidal thoughts - 0 -  PHQ-9 Score - 4 -  Difficult doing work/chores - Not difficult at all -    No results found for this or any previous visit (from the past 2160 hour(s)).  Assessment & Plan:    1. Medicare annual wellness visit, subsequent  Discussed importance of 150 minutes of physical activity weekly, eat two servings of fish weekly, eat one serving of tree nuts ( cashews, pistachios, pecans, almonds.Marland Kitchen) every other day, eat 6 servings of fruit/vegetables daily and drink plenty of water and avoid sweet beverages.  - MM Digital Screening; Future - CBC with Differential/Platelet - COMPLETE METABOLIC PANEL WITH GFR  2. Obesity (BMI 30.0-34.9)  Discussed with the patient the risk posed by an  increased BMI. Discussed importance of portion control, calorie counting and at least 150 minutes of physical activity weekly. Avoid sweet beverages and drink more water. Eat at least 6 servings of fruit and vegetables daily   3. Family history of breast cancer  - MM Digital Screening; Future  4. Screening for deficiency anemia  - CBC with Differential/Platelet  5. Diabetes mellitus screening  Not covered   6. Lipid screening  Not covered by insurance  7. Long-term use of high-risk medication  - COMPLETE METABOLIC PANEL WITH GFR  8. Encounter for screening mammogram for breast cancer  - MM Digital Screening; Future   Immunization History  Administered Date(s) Administered  . HPV Quadrivalent 12/11/2006  . Influenza, Seasonal, Injecte, Preservative Fre 04/06/2015  . Influenza,inj,Quad PF,6+ Mos 03/06/2016, 05/03/2017  . Influenza-Unspecified 04/06/2015  . Td 10/10/2012, 04/06/2015  . Tdap 03/04/2012    Health Maintenance  Topic Date Due  . PAP SMEAR  03/07/2019  . TETANUS/TDAP  04/05/2025  . INFLUENZA VACCINE  Completed  . HIV Screening  Completed    No orders of the defined types were placed in this encounter.   Current Outpatient Medications:  .  doxycycline (MONODOX) 50 MG capsule, Take 50 mg daily by mouth., Disp: , Rfl:  .  levonorgestrel (MIRENA) 20 MCG/24HR IUD, 1 each by Intrauterine route once., Disp: , Rfl:  There are no discontinued medications.  I have personally reviewed and addressed the Medicare Annual Wellness health risk assessment questionnaire and have noted the following in the patient's chart:  A.         Medical and social history & family history B.         Use of alcohol, tobacco, and illicit  drugs  C.         Current medications and supplements D.         Functional and Cognitive ability and status E.         Nutritional status F.         Physical activity G.        Advance directives H.         List of other physicians I.           Hospitalizations, surgeries, and ER visits in previous 12 months J.         [place] such as hearing, vision, cognitive function, and depression L.         Referrals and appointments: mammogram   In addition, I have reviewed and discussed with patient certain preventive protocols, quality metrics, and best practice recommendations. A written personalized care plan for preventive services as well as general preventive health recommendations were provided to patient.  See attached scanned questionnaire for additional information.

## 2017-10-24 DIAGNOSIS — L700 Acne vulgaris: Secondary | ICD-10-CM | POA: Diagnosis not present

## 2018-02-06 ENCOUNTER — Ambulatory Visit (INDEPENDENT_AMBULATORY_CARE_PROVIDER_SITE_OTHER): Payer: Medicare Other | Admitting: Family Medicine

## 2018-02-06 ENCOUNTER — Encounter: Payer: Self-pay | Admitting: Family Medicine

## 2018-02-06 VITALS — BP 100/56 | HR 78 | Temp 98.6°F | Resp 14 | Ht 60.0 in | Wt 162.5 lb

## 2018-02-06 DIAGNOSIS — H9193 Unspecified hearing loss, bilateral: Secondary | ICD-10-CM | POA: Diagnosis not present

## 2018-02-06 DIAGNOSIS — E669 Obesity, unspecified: Secondary | ICD-10-CM

## 2018-02-06 DIAGNOSIS — Z23 Encounter for immunization: Secondary | ICD-10-CM | POA: Diagnosis not present

## 2018-02-06 DIAGNOSIS — Z1231 Encounter for screening mammogram for malignant neoplasm of breast: Secondary | ICD-10-CM | POA: Diagnosis not present

## 2018-02-06 DIAGNOSIS — Z803 Family history of malignant neoplasm of breast: Secondary | ICD-10-CM

## 2018-02-06 NOTE — Progress Notes (Signed)
Name: [name]   MRN: [id number]    DOB: [birth year]   Date:02/06/2018       Progress Note  Subjective  Chief Complaint  Chief Complaint  Patient presents with  . Breast Problem    HPI  Her mother had breast cancer and she would like to start breast cancer screening. We will place order for bilateral mammogram but also advised her to contact insurance to verify coverage. She denies nipple discharge, breast lumps or redness.   Obesity: with no other risk factors. Discussed healthy diet, avoid soda ( she states she does not drink sodas) , fast food, juices . Try to exercise 30 minutes 5 days a week, drink plenty of water, eat healthy snacks such as tree nuts, vegetables and fruit   Bilateral hearing loss: since 59 months of age after she had meningitis, she did not come in with translator today and we used paper and pen to ask and answer questions today.    Patient Active Problem List   Diagnosis Date Noted  . S/P cesarean section 01/13/2013  . Bilateral deafness 01/12/2013    Past Surgical History:  Procedure Laterality Date  . CESAREAN SECTION    . CESAREAN SECTION N/A 01/13/2013   Procedure: REPEAT CESAREAN SECTION;  Surgeon: Thornell Sartorius, MD;  Location: [facility];  Service: Obstetrics;  Laterality: N/A;  Jamie from office called and states pt is deaf and needs interpreter on day of surgery.  EDA Royal called and intrepreter scheduled for day of surgery.  . CESAREAN SECTION N/A 06/28/2015   Procedure: CESAREAN SECTION;  Surgeon: Janyth Contes, MD;  Location: [facility];  Service: Obstetrics;  Laterality: N/A;  . WISDOM TOOTH EXTRACTION      Family History  Problem Relation Age of Onset  . Breast cancer Mother   . Diabetes Father   . Hypertension Brother   . Breast cancer Paternal Grandmother   . Breast cancer Maternal Aunt   . Heart attack Paternal Aunt   . Prostate cancer Paternal Uncle     Social History   Socioeconomic History  . Marital status: Married    Spouse  name: [place] Throckmorton  . Number of children: 3  . Years of education: Not on file  . Highest education level: Associate degree: academic program  Occupational History  . Occupation: disabled     Comment: hearing  Social Needs  . Financial resource strain: Somewhat hard  . Food insecurity:    Worry: Sometimes true    Inability: Often true  . Transportation needs:    Medical: No    Non-medical: No  Tobacco Use  . Smoking status: Never Smoker  . Smokeless tobacco: Never Used  Substance and Sexual Activity  . Alcohol use: Yes    Alcohol/week: 1.0 standard drinks    Types: 1 Glasses of wine per week    Comment: sometimes  . Drug use: Never  . Sexual activity: Yes    Partners: Male    Birth control/protection: IUD  Lifestyle  . Physical activity:    Days per week: 0 days    Minutes per session: 0 min  . Stress: Not at all  Relationships  . Social connections:    Talks on phone: More than three times a week    Gets together: Twice a week    Attends religious service: 1 to 4 times per year    Active member of club or organization: No    Attends meetings of clubs or organizations:  Never    Relationship status: Married  . Intimate partner violence:    Fear of current or ex partner: No    Emotionally abused: No    Physically abused: No    Forced sexual activity: No  Other Topics Concern  . Not on file  Social History Narrative   Married with 3 children, stay at home with her children     Current Outpatient Medications:  .  doxycycline (MONODOX) 50 MG capsule, Take 50 mg daily by mouth., Disp: , Rfl:  .  levonorgestrel (MIRENA) 20 MCG/24HR IUD, 1 each by Intrauterine route once., Disp: , Rfl:   Allergies  Allergen Reactions  . Nickel Rash     ROS  Constitutional: Negative for fever or weight change.  Respiratory: Negative for cough and shortness of breath.   Cardiovascular: Negative for chest pain or palpitations.  Gastrointestinal: Negative for abdominal pain, no  bowel changes.  Musculoskeletal: Negative for gait problem or joint swelling.  Skin: Negative for rash.  Neurological: Negative for dizziness or headache.  No other specific complaints in a complete review of systems (except as listed in HPI above).  Objective  Vitals:   02/06/18 0825  BP: (!) 100/56  Pulse: 78  Resp: 14  Temp: 98.6 F (37 C)  TempSrc: Oral  SpO2: 97%  Weight: 162 lb 8 oz (73.7 kg)  Height: 5' (1.524 m)    Body mass index is 31.74 kg/m.  Physical Exam  Constitutional: Patient appears well-developed and well-nourished. Obese No distress.  HEENT: head atraumatic, normocephalic, pupils equal and reactive to light, ears normal TM bilaterally  neck supple, throat within normal limits Cardiovascular: Normal rate, regular rhythm and normal heart sounds.  No murmur heard. No BLE edema. Pulmonary/Chest: Effort normal and breath sounds normal. No respiratory distress. Abdominal: Soft.  There is no tenderness. Psychiatric: Patient has a normal mood and affect. behavior is normal. Judgment and thought content normal.  PHQ2/9: Depression screen [facility] 2/9 02/06/2018 07/04/2017 05/03/2017 03/06/2016  Decreased Interest 0 0 3 0  Down, Depressed, Hopeless 0 0 1 0  PHQ - 2 Score 0 0 4 0  Altered sleeping 0 - 0 -  Tired, decreased energy 0 - 0 -  Change in appetite 0 - 0 -  Feeling bad or failure about yourself  0 - 0 -  Trouble concentrating 0 - 0 -  Moving slowly or fidgety/restless 0 - 0 -  Suicidal thoughts 0 - 0 -  PHQ-9 Score 0 - 4 -  Difficult doing work/chores Not difficult at all - Not difficult at all -    Fall Risk: Fall Risk  02/06/2018 07/04/2017 05/03/2017 03/06/2016  Falls in the past year? No No No No     Functional Status Survey: Is the patient deaf or have difficulty hearing?: Yes Does the patient have difficulty seeing, even when wearing glasses/contacts?: No Does the patient have difficulty concentrating, remembering, or making decisions?: No Does  the patient have difficulty walking or climbing stairs?: No Does the patient have difficulty dressing or bathing?: No Does the patient have difficulty doing errands alone such as visiting a doctor's office or shopping?: No    Assessment & Plan   1. Family history of breast cancer  - MM DIGITAL SCREENING BILATERAL  2. Obesity (BMI 30.0-34.9)  Discussed with the patient the risk posed by an increased BMI. Discussed importance of portion control, calorie counting and at least 150 minutes of physical activity weekly. Avoid sweet beverages and drink  more water. Eat at least 6 servings of fruit and vegetables daily   3. Needs flu shot  - flu vaccine today   4. Bilateral deafness  stable  5. Encounter for screening mammogram for breast cancer  - MM DIGITAL SCREENING BILATERAL

## 2018-02-06 NOTE — Patient Instructions (Signed)

## 2018-08-23 ENCOUNTER — Ambulatory Visit: Payer: Medicare Other

## 2018-09-05 ENCOUNTER — Ambulatory Visit (INDEPENDENT_AMBULATORY_CARE_PROVIDER_SITE_OTHER): Payer: Medicare Other

## 2018-09-05 ENCOUNTER — Other Ambulatory Visit: Payer: Self-pay

## 2018-09-05 VITALS — BP 104/66 | HR 64 | Temp 97.9°F | Resp 16 | Ht 60.0 in | Wt 164.3 lb

## 2018-09-05 DIAGNOSIS — Z0000 Encounter for general adult medical examination without abnormal findings: Secondary | ICD-10-CM | POA: Diagnosis not present

## 2018-09-05 NOTE — Patient Instructions (Signed)
[name] , Thank you for taking time to come for your Medicare Wellness Visit. I appreciate your ongoing commitment to your health goals. Please review the following plan we discussed and let me know if I can assist you in the future.   Screening recommendations/referrals: Mammogram: Please call [phone] to schedule your mammogram.   Recommended yearly ophthalmology/optometry visit for glaucoma screening and checkup Recommended yearly dental visit for hygiene and checkup  Vaccinations: Influenza vaccine: done 02/06/18 Tdap vaccine: done 04/06/15  Advanced directives: Advance directive discussed with you today. I have provided a copy for you to complete at home and have notarized. Once this is complete please bring a copy in to our office so we can scan it into your chart.  Conditions/risks identified: Recommend increasing physical activity to at least 150 minutes per week.   Next appointment: Please follow up in one year for your Medicare Annual Wellness visit. '   Preventive Care 40-64 Years, Female Preventive care refers to lifestyle choices and visits with your health care provider that can promote health and wellness. What does preventive care include?  A yearly physical exam. This is also called an annual well check.  Dental exams once or twice a year.  Routine eye exams. Ask your health care provider how often you should have your eyes checked.  Personal lifestyle choices, including:  Daily care of your teeth and gums.  Regular physical activity.  Eating a healthy diet.  Avoiding tobacco and drug use.  Limiting alcohol use.  Practicing safe sex.  Taking low-dose aspirin daily starting at age 56.  Taking vitamin and mineral supplements as recommended by your health care provider. What happens during an annual well check? The services and screenings done by your health care provider during your annual well check will depend on your age, overall health,  lifestyle risk factors, and family history of disease. Counseling  Your health care provider may ask you questions about your:  Alcohol use.  Tobacco use.  Drug use.  Emotional well-being.  Home and relationship well-being.  Sexual activity.  Eating habits.  Work and work Statistician.  Method of birth control.  Menstrual cycle.  Pregnancy history. Screening  You may have the following tests or measurements:  Height, weight, and BMI.  Blood pressure.  Lipid and cholesterol levels. These may be checked every 5 years, or more frequently if you are over 65 years old.  Skin check.  Lung cancer screening. You may have this screening every year starting at age 80 if you have a 30-pack-year history of smoking and currently smoke or have quit within the past 15 years.  Fecal occult blood test (FOBT) of the stool. You may have this test every year starting at age 66.  Flexible sigmoidoscopy or colonoscopy. You may have a sigmoidoscopy every 5 years or a colonoscopy every 10 years starting at age 28.  Hepatitis C blood test.  Hepatitis B blood test.  Sexually transmitted disease (STD) testing.  Diabetes screening. This is done by checking your blood sugar (glucose) after you have not eaten for a while (fasting). You may have this done every 1-3 years.  Mammogram. This may be done every 1-2 years. Talk to your health care provider about when you should start having regular mammograms. This may depend on whether you have a family history of breast cancer.  BRCA-related cancer screening. This may be done if you have a family history of breast, ovarian, tubal, or peritoneal cancers.  Pelvic exam and  Pap test. This may be done every 3 years starting at age 83. Starting at age 53, this may be done every 5 years if you have a Pap test in combination with an HPV test.  Bone density scan. This is done to screen for osteoporosis. You may have this scan if you are at high risk for  osteoporosis. Discuss your test results, treatment options, and if necessary, the need for more tests with your health care provider. Vaccines  Your health care provider may recommend certain vaccines, such as:  Influenza vaccine. This is recommended every year.  Tetanus, diphtheria, and acellular pertussis (Tdap, Td) vaccine. You may need a Td booster every 10 years.  Zoster vaccine. You may need this after age 38.  Pneumococcal 13-valent conjugate (PCV13) vaccine. You may need this if you have certain conditions and were not previously vaccinated.  Pneumococcal polysaccharide (PPSV23) vaccine. You may need one or two doses if you smoke cigarettes or if you have certain conditions. Talk to your health care provider about which screenings and vaccines you need and how often you need them. This information is not intended to replace advice given to you by your health care provider. Make sure you discuss any questions you have with your health care provider. Document Released: 07/02/2015 Document Revised: 02/23/2016 Document Reviewed: 04/06/2015 Elsevier Interactive Patient Education  2017 [place] Prevention in the Home Falls can cause injuries. They can happen to people of all ages. There are many things you can do to make your home safe and to help prevent falls. What can I do on the outside of my home?  Regularly fix the edges of walkways and driveways and fix any cracks.  Remove anything that might make you trip as you walk through a door, such as a raised step or threshold.  Trim any bushes or trees on the path to your home.  Use bright outdoor lighting.  Clear any walking paths of anything that might make someone trip, such as rocks or tools.  Regularly check to see if handrails are loose or broken. Make sure that both sides of any steps have handrails.  Any raised decks and porches should have guardrails on the edges.  Have any leaves, snow, or ice cleared  regularly.  Use sand or salt on walking paths during winter.  Clean up any spills in your garage right away. This includes oil or grease spills. What can I do in the bathroom?  Use night lights.  Install grab bars by the toilet and in the tub and shower. Do not use towel bars as grab bars.  Use non-skid mats or decals in the tub or shower.  If you need to sit down in the shower, use a plastic, non-slip stool.  Keep the floor dry. Clean up any water that spills on the floor as soon as it happens.  Remove soap buildup in the tub or shower regularly.  Attach bath mats securely with double-sided non-slip rug tape.  Do not have throw rugs and other things on the floor that can make you trip. What can I do in the bedroom?  Use night lights.  Make sure that you have a light by your bed that is easy to reach.  Do not use any sheets or blankets that are too big for your bed. They should not hang down onto the floor.  Have a firm chair that has side arms. You can use this for support while you  get dressed.  Do not have throw rugs and other things on the floor that can make you trip. What can I do in the kitchen?  Clean up any spills right away.  Avoid walking on wet floors.  Keep items that you use a lot in easy-to-reach places.  If you need to reach something above you, use a strong step stool that has a grab bar.  Keep electrical cords out of the way.  Do not use floor polish or wax that makes floors slippery. If you must use wax, use non-skid floor wax.  Do not have throw rugs and other things on the floor that can make you trip. What can I do with my stairs?  Do not leave any items on the stairs.  Make sure that there are handrails on both sides of the stairs and use them. Fix handrails that are broken or loose. Make sure that handrails are as long as the stairways.  Check any carpeting to make sure that it is firmly attached to the stairs. Fix any carpet that is loose  or worn.  Avoid having throw rugs at the top or bottom of the stairs. If you do have throw rugs, attach them to the floor with carpet tape.  Make sure that you have a light switch at the top of the stairs and the bottom of the stairs. If you do not have them, ask someone to add them for you. What else can I do to help prevent falls?  Wear shoes that:  Do not have high heels.  Have rubber bottoms.  Are comfortable and fit you well.  Are closed at the toe. Do not wear sandals.  If you use a stepladder:  Make sure that it is fully opened. Do not climb a closed stepladder.  Make sure that both sides of the stepladder are locked into place.  Ask someone to hold it for you, if possible.  Clearly mark and make sure that you can see:  Any grab bars or handrails.  First and last steps.  Where the edge of each step is.  Use tools that help you move around (mobility aids) if they are needed. These include:  Canes.  Walkers.  Scooters.  Crutches.  Turn on the lights when you go into a dark area. Replace any light bulbs as soon as they burn out.  Set up your furniture so you have a clear path. Avoid moving your furniture around.  If any of your floors are uneven, fix them.  If there are any pets around you, be aware of where they are.  Review your medicines with your doctor. Some medicines can make you feel dizzy. This can increase your chance of falling. Ask your doctor what other things that you can do to help prevent falls. This information is not intended to replace advice given to you by your health care provider. Make sure you discuss any questions you have with your health care provider. Document Released: 04/01/2009 Document Revised: 11/11/2015 Document Reviewed: 07/10/2014 Elsevier Interactive Patient Education  2017 [employer].

## 2018-09-05 NOTE — Progress Notes (Signed)
Subjective:   [name] is a 38 y.o. female who presents for Medicare Annual (Subsequent) preventive examination.  Review of Systems:   Cardiac Risk Factors include: none     Objective:     Vitals: BP 104/66 (BP Location: Right Arm, Patient Position: Sitting, Cuff Size: Normal)   Pulse 64   Temp 97.9 F (36.6 C) (Oral)   Resp 16   Ht 5' (1.524 m)   Wt 164 lb 4.8 oz (74.5 kg)   SpO2 99%   BMI 32.09 kg/m   Body mass index is 32.09 kg/m.  Advanced Directives 09/05/2018 03/06/2016 06/28/2015 06/25/2015 01/10/2013  Does Patient Have a Medical Advance Directive? No Yes No No Patient does not have advance directive  Type of Advance Directive - [place];Living will - - -  Does patient want to make changes to medical advance directive? - No - Patient declined - - -  Copy of [place] in Chart? - No - copy requested - - -  Would patient like information on creating a medical advance directive? Yes (MAU/Ambulatory/Procedural Areas - Information given) - No - patient declined information No - patient declined information -  Pre-existing out of facility DNR order (yellow form or pink MOST form) - - - - No    Tobacco Social History   Tobacco Use  Smoking Status Never Smoker  Smokeless Tobacco Never Used     Counseling given: Not Answered   Clinical Intake:  Pre-visit preparation completed: Yes  Pain : No/denies pain     BMI - recorded: 32.09 Nutritional Status: BMI > 30  Obese Nutritional Risks: None Diabetes: No  How often do you need to have someone help you when you read instructions, pamphlets, or other written materials from your doctor or pharmacy?: 1 - Never What is the last grade level you completed in school?: associate degree  Interpreter Needed?: Yes Interpreter Agency: [facility] Interpreter Name: [name] Patient Declined Interpreter : No  Comments: sign language interpreter Information entered by :: Dominique Marker  LPN  Past Medical History:  Diagnosis Date  . Deafness   . Gastroesophageal reflux in pregancy   . GERD (gastroesophageal reflux disease)   . IUD (intrauterine device) in place 08/11/2015  . Normal pregnancy, repeat 01/12/2013  . Normal pregnancy, repeat 06/27/2015  . PONV (postoperative nausea and vomiting)   . S/P cesarean section 01/13/2013   Past Surgical History:  Procedure Laterality Date  . CESAREAN SECTION    . CESAREAN SECTION N/A 01/13/2013   Procedure: REPEAT CESAREAN SECTION;  Surgeon: Dominique Sartorius, MD;  Location: [facility];  Service: Obstetrics;  Laterality: N/A;  [name] from office called and states pt is deaf and needs interpreter on day of surgery.  [name] called and intrepreter scheduled for day of surgery.  . CESAREAN SECTION N/A 06/28/2015   Procedure: CESAREAN SECTION;  Surgeon: Dominique Contes, MD;  Location: [facility];  Service: Obstetrics;  Laterality: N/A;  . WISDOM TOOTH EXTRACTION     Family History  Problem Relation Age of Onset  . Breast cancer Mother   . Diabetes Father   . Hypertension Brother   . Breast cancer Paternal Grandmother   . Breast cancer Maternal Aunt   . Heart attack Paternal Aunt   . Prostate cancer Paternal Uncle    Social History   Socioeconomic History  . Marital status: Married    Spouse name: [name]  . Number of children: 3  . Years  of education: Not on file  . Highest education level: Associate degree: academic program  Occupational History  . Occupation: disabled     Comment: hearing  Social Needs  . Financial resource strain: Not very hard  . Food insecurity:    Worry: Sometimes true    Inability: Sometimes true  . Transportation needs:    Medical: No    Non-medical: No  Tobacco Use  . Smoking status: Never Smoker  . Smokeless tobacco: Never Used  Substance and Sexual Activity  . Alcohol use: Not Currently    Alcohol/week: 1.0 standard drinks    Types: 1 Glasses of wine per week  . Drug use: Never  . Sexual  activity: Yes    Partners: Male    Birth control/protection: I.U.D.  Lifestyle  . Physical activity:    Days per week: 0 days    Minutes per session: 0 min  . Stress: To some extent  Relationships  . Social connections:    Talks on phone: More than three times a week    Gets together: Three times a week    Attends religious service: Never    Active member of club or organization: No    Attends meetings of clubs or organizations: Never    Relationship status: Married  Other Topics Concern  . Not on file  Social History Narrative   Married with 3 children, stay at home with her children    Outpatient Encounter Medications as of 09/05/2018  Medication Sig  . levonorgestrel (MIRENA) 20 MCG/24HR IUD 1 each by Intrauterine route once.  . [DISCONTINUED] doxycycline (MONODOX) 50 MG capsule Take 50 mg daily by mouth.   No facility-administered encounter medications on file as of 09/05/2018.     Activities of Daily Living In your present state of health, do you have any difficulty performing the following activities: 09/05/2018 02/06/2018  Hearing? [name]  Vision? N N  Difficulty concentrating or making decisions? N N  Walking or climbing stairs? N N  Dressing or bathing? N N  Doing errands, shopping? N N  Preparing Food and eating ? N -  Using the Toilet? N -  In the past six months, have you accidently leaked urine? N -  Do you have problems with loss of bowel control? N -  Managing your Medications? N -  Managing your Finances? N -  Housekeeping or managing your Housekeeping? N -  Some recent data might be hidden    Patient Care Team: Dominique Sizer, MD as PCP - General (Family Medicine) Dominique Bathe, MD as Consulting Physician (Dermatology)    Assessment:   This is a routine wellness examination for [name].  Exercise Activities and Dietary recommendations Current Exercise Habits: The patient does not participate in regular exercise at present, Exercise limited by:  None identified  Goals    . Patient Stated     Pt states she would like to start working again this year when her youngest child starts school.        Fall Risk Fall Risk  09/05/2018 02/06/2018 07/04/2017 05/03/2017 03/06/2016  Falls in the past year? Exclusion - non ambulatory No No No No  Number falls in past yr: 0 - - - -  Injury with Fall? 0 - - - -  Follow up Falls prevention discussed - - - -   FALL RISK PREVENTION PERTAINING TO THE HOME:  Any stairs in or around the home? No  If so, do they handrails? No  Home free of loose throw rugs in walkways, pet beds, electrical cords, etc? Yes  Adequate lighting in your home to reduce risk of falls? Yes   ASSISTIVE DEVICES UTILIZED TO PREVENT FALLS:  Life alert? No  Use of a cane, walker or w/c? No  Grab bars in the bathroom? No Shower chair or bench in shower? No  Elevated toilet seat or a handicapped toilet? No   DME ORDERS:  DME order needed?  No   TIMED UP AND GO:  Was the test performed? Yes .  Length of time to ambulate 10 feet: 5 sec.   GAIT:  Appearance of gait: Gait stead-fast and without the use of an assistive device.    Education: Fall risk prevention has been discussed.  Intervention(s) required? No   Depression Screen PHQ 2/9 Scores 09/05/2018 02/06/2018 07/04/2017 05/03/2017  PHQ - 2 Score 2 0 0 4  PHQ- 9 Score 3 0 - 4     Cognitive Function        Immunization History  Administered Date(s) Administered  . HPV Quadrivalent 12/11/2006  . Influenza, Seasonal, Injecte, Preservative Fre 04/06/2015  . Influenza,inj,Quad PF,6+ Mos 03/06/2016, 05/03/2017, 02/06/2018  . Influenza-Unspecified 04/06/2015  . Td 10/10/2012, 04/06/2015  . Tdap 03/04/2012    Qualifies for Shingles Vaccine? No    Tdap: Up to date  Flu Vaccine: Up to date  Pneumococcal Vaccine: not due at this time  Screening Tests Health Maintenance  Topic Date Due  . PAP SMEAR-Modifier  03/07/2019  . TETANUS/TDAP  04/05/2025   . INFLUENZA VACCINE  Completed  . HIV Screening  Completed    Cancer Screenings:  Mammogram: Ordered 02/06/18. Pt provided with contact information and advised to call to schedule appt. Pt with strong family history of breast cancer  Lung Cancer Screening: (Low Dose CT Chest recommended if Age 57-80 years, 30 pack-year currently smoking OR have quit w/in 15years.) does not qualify.   Additional Screening:  Hepatitis C Screening: does not qualify  Vision Screening: Recommended annual ophthalmology exams for early detection of glaucoma and other disorders of the eye. Is the patient up to date with their annual eye exam?  No  Who is the provider or what is the name of the office in which the pt attends annual eye exams? Not established with a provider If pt is not established with a provider, would they like to be referred to a provider to establish care? No .   Dental Screening: Recommended annual dental exams for proper oral hygiene  Community Resource Referral:  CRR required this visit?  No      Plan:     I have personally reviewed and addressed the Medicare Annual Wellness questionnaire and have noted the following in the patient's chart:  A. Medical and social history B. Use of alcohol, tobacco or illicit drugs  C. Current medications and supplements D. Functional ability and status E.  Nutritional status F.  Physical activity G. Advance directives H. List of other physicians I.  Hospitalizations, surgeries, and ER visits in previous 12 months J.  Justice such as hearing and vision if needed, cognitive and depression L. Referrals and appointments   In addition, I have reviewed and discussed with patient certain preventive protocols, quality metrics, and best practice recommendations. A written personalized care plan for preventive services as well as general preventive health recommendations were provided to patient.   Signed,   Dominique Marker, LPN Nurse  Health Advisor   Nurse Notes:  pt doing well and appreciative of visit today. Accompanied to visit today by sign language interpreter from [facility]

## 2018-12-04 ENCOUNTER — Ambulatory Visit: Payer: Medicare Other | Admitting: Family Medicine

## 2018-12-09 ENCOUNTER — Ambulatory Visit: Payer: Self-pay | Admitting: *Deleted

## 2018-12-09 NOTE — Telephone Encounter (Signed)
Pt called 2 additional times and VM have not been returned. Left Vm that stated that pt can call us back or go to [facility]. Will forward to PCP office

## 2018-12-09 NOTE — Telephone Encounter (Signed)
Summary: Clinical Call    Patient stated that she had a lump in the back of her left ear and she is unsure what she needs to do and would like to speak with a nurse. Patient also would like to make aware that she is deaf and will have an interpreter on the line. Please advise.      Attempted to call patient to regarding her message regarding a lump on the back of her ear. Left message for the patient to call back.

## 2018-12-10 NOTE — Telephone Encounter (Signed)
  Answer Assessment - Initial Assessment Questions 1. APPEARANCE of SWELLING: "What does it look like?" (e.g., lymph node, insect bite, mole)     Unsure 2. SIZE: "How large is the swelling?" (inches, cm or compare to coins)     Small 3. LOCATION: "Where is the swelling located?"     Behind left ear 4. ONSET: "When did the swelling start?"     Noticed this 2 days ago 5. PAIN: "Is it painful?" If so, ask: "How much?"     No 6. ITCH: "Does it itch?" If so, ask: "How much?"     No 7. CAUSE: "What do you think caused the swelling?"     Unsure 8. OTHER SYMPTOMS: "Do you have any other symptoms?" (e.g., fever)     No  Protocols used: SKIN LUMP OR LOCALIZED SWELLING-A-AH

## 2018-12-10 NOTE — Telephone Encounter (Signed)
Pt. Noticed a lump behind her left ear 2 days ago.Not painful, small in size. Denies any fever or illness. Ic concerned about what this could be. Warm transfer to [facility] in the practice.

## 2018-12-12 ENCOUNTER — Ambulatory Visit (INDEPENDENT_AMBULATORY_CARE_PROVIDER_SITE_OTHER): Payer: Medicare Other | Admitting: Family Medicine

## 2018-12-12 ENCOUNTER — Encounter: Payer: Self-pay | Admitting: Family Medicine

## 2018-12-12 ENCOUNTER — Other Ambulatory Visit: Payer: Self-pay

## 2018-12-12 DIAGNOSIS — R590 Localized enlarged lymph nodes: Secondary | ICD-10-CM | POA: Diagnosis not present

## 2018-12-12 DIAGNOSIS — M79671 Pain in right foot: Secondary | ICD-10-CM

## 2018-12-12 MED ORDER — AMOXICILLIN-POT CLAVULANATE 875-125 MG PO TABS: 1.0000 | ORAL_TABLET | Freq: Two times a day (BID) | ORAL | 0 refills | Status: DC

## 2018-12-12 NOTE — Progress Notes (Signed)
Name: [name]   MRN: [id number]    DOB: 03/06/1981   Date:12/12/2018       Progress Note  Subjective  Chief Complaint  Chief Complaint  Patient presents with  . Mass    Onset-Monday morning she noticed, left ear     I connected with  [name] on 12/12/18 at 10:20 AM EDT by telephone and verified that I am speaking with the correct person using two identifiers.  I discussed the limitations, risks, security and privacy concerns of performing an evaluation and management service by telephone and the availability of in person appointments. Staff also discussed with the patient that there may be a patient responsible charge related to this service. Patient Location: at home  Provider Location: [facility]  Additional Individuals present: interpreter - she is hearing impaired   HPI  Lump behind left ear about a bean size since Monday, no pain, redness or increase in warmth. No nausea, vomiting . It is hard. No lesions on scalp or face . Discussed it may be lymphadenopathy, we will try antibiotics but explained that if no resolution needs to contact me back for referral to ENT  Foot pain: going on for the past couple of weeks, pain is present around her heel when she first gets up but improves when walking, wearing flip flops lately. No redness , no injury  Patient Active Problem List   Diagnosis Date Noted  . S/P cesarean section 01/13/2013  . Bilateral deafness 01/12/2013    Past Surgical History:  Procedure Laterality Date  . CESAREAN SECTION    . CESAREAN SECTION N/A 01/13/2013   Procedure: REPEAT CESAREAN SECTION;  Surgeon: Thornell Sartorius, MD;  Location: [facility];  Service: Obstetrics;  Laterality: N/A;  Jamie from office called and states pt is deaf and needs interpreter on day of surgery.  EDA Royal called and intrepreter scheduled for day of surgery.  . CESAREAN SECTION N/A 06/28/2015   Procedure: CESAREAN SECTION;  Surgeon: Janyth Contes, MD;   Location: [facility];  Service: Obstetrics;  Laterality: N/A;  . WISDOM TOOTH EXTRACTION      Family History  Problem Relation Age of Onset  . Breast cancer Mother   . Diabetes Father   . Hypertension Brother   . Breast cancer Paternal Grandmother   . Breast cancer Maternal Aunt   . Heart attack Paternal Aunt   . Prostate cancer Paternal Uncle     Social History   Socioeconomic History  . Marital status: Married    Spouse name: [name]  . Number of children: 3  . Years of education: Not on file  . Highest education level: Associate degree: academic program  Occupational History  . Occupation: disabled     Comment: hearing  Social Needs  . Financial resource strain: Not very hard  . Food insecurity    Worry: Sometimes true    Inability: Sometimes true  . Transportation needs    Medical: No    Non-medical: No  Tobacco Use  . Smoking status: Never Smoker  . Smokeless tobacco: Never Used  Substance and Sexual Activity  . Alcohol use: Not Currently    Alcohol/week: 1.0 standard drinks    Types: 1 Glasses of wine per week  . Drug use: Never  . Sexual activity: Yes    Partners: Male    Birth control/protection: I.U.D.  Lifestyle  . Physical activity    Days per week: 0 days    Minutes  per session: 0 min  . Stress: To some extent  Relationships  . Social connections    Talks on phone: More than three times a week    Gets together: Three times a week    Attends religious service: Never    Active member of club or organization: No    Attends meetings of clubs or organizations: Never    Relationship status: Married  . Intimate partner violence    Fear of current or ex partner: No    Emotionally abused: No    Physically abused: No    Forced sexual activity: No  Other Topics Concern  . Not on file  Social History Narrative   Married with 3 children, stay at home with her children     Current Outpatient Medications:  .  levonorgestrel (MIRENA) 20 MCG/24HR  IUD, 1 each by Intrauterine route once., Disp: , Rfl:   Allergies  Allergen Reactions  . Nickel Rash    I personally reviewed active problem list, medication list, allergies, family history, social history with the patient/caregiver today.   ROS  Ten systems reviewed and is negative except as mentioned in HPI   Objective  Virtual encounter, vitals not obtained.   Physical Exam  Using interpreter , not able to see patient Awake, alert and oriented    PHQ2/9: Depression screen [facility] 2/9 12/12/2018 09/05/2018 02/06/2018 07/04/2017 05/03/2017  Decreased Interest 0 2 0 0 3  Down, Depressed, Hopeless 0 0 0 0 1  PHQ - 2 Score 0 2 0 0 4  Altered sleeping 0 0 0 - 0  Tired, decreased energy 0 1 0 - 0  Change in appetite 0 0 0 - 0  Feeling bad or failure about yourself  0 0 0 - 0  Trouble concentrating 0 0 0 - 0  Moving slowly or fidgety/restless 0 0 0 - 0  Suicidal thoughts 0 0 0 - 0  PHQ-9 Score 0 3 0 - 4  Difficult doing work/chores Not difficult at all Somewhat difficult Not difficult at all - Not difficult at all   PHQ-2/9 Result is negative.    Fall Risk: Fall Risk  12/12/2018 09/05/2018 02/06/2018 07/04/2017 05/03/2017  Falls in the past year? 0 Exclusion - non ambulatory No No No  Number falls in past yr: 0 0 - - -  Injury with Fall? 0 0 - - -  Follow up - Falls prevention discussed - - -     Assessment & Plan   1. Posterior auricular lymphadenopathy  - amoxicillin-clavulanate (AUGMENTIN) 875-125 MG tablet; Take 1 tablet by mouth 2 (two) times daily.  Dispense: 20 tablet; Refill: 0  2. Acute pain of right foot  Discussed possible plantar fascitis, try icing it, stretching and stop wearing flip flops, wear tennis shoes or get OOFOS to wear at home  I discussed the assessment and treatment plan with the patient. The patient was provided an opportunity to ask questions and all were answered. The patient agreed with the plan and demonstrated an understanding of the  instructions.   The patient was advised to call back or seek an in-person evaluation if the symptoms worsen or if the condition fails to improve as anticipated.  I provided 15 minutes of non-face-to-face time during this encounter.  Loistine Chance, MD

## 2018-12-12 NOTE — Patient Instructions (Signed)
Plantar Fasciitis      Plantar fasciitis is a painful foot condition that affects the heel. It occurs when the band of tissue that connects the toes to the heel bone (plantar fascia) becomes irritated. This can happen as the result of exercising too much or doing other repetitive activities (overuse injury).  The pain from plantar fasciitis can range from mild irritation to severe pain that makes it difficult to walk or move. The pain is usually worse in the morning after sleeping, or after sitting or lying down for a while. Pain may also be worse after long periods of walking or standing.  What are the causes?  This condition may be caused by:  Standing for long periods of time.  Wearing shoes that do not have good arch support.  Doing activities that put stress on joints (high-impact activities), including running, aerobics, and ballet.  Being overweight.  An abnormal way of walking (gait).  Tight muscles in the back of your lower leg (calf).  High arches in your feet.  Starting a new athletic activity.  What are the signs or symptoms?  The main symptom of this condition is heel pain. Pain may:  Be worse with first steps after a time of rest, especially in the morning after sleeping or after you have been sitting or lying down for a while.  Be worse after long periods of standing still.  Decrease after 30-45 minutes of activity, such as gentle walking.  How is this diagnosed?  This condition may be diagnosed based on your medical history and your symptoms. Your health care provider may ask questions about your activity level. Your health care provider will do a physical exam to check for:  A tender area on the bottom of your foot.  A high arch in your foot.  Pain when you move your foot.  Difficulty moving your foot.  You may have imaging tests to confirm the diagnosis, such as:  X-rays.  Ultrasound.  MRI.  How is this treated?  Treatment for plantar fasciitis depends on how severe your condition is.  Treatment may include:  Rest, ice, applying pressure (compression), and raising the affected foot (elevation). This may be called RICE therapy. Your health care provider may recommend RICE therapy along with over-the-counter pain medicines to manage your pain.  Exercises to stretch your calves and your plantar fascia.  A splint that holds your foot in a stretched, upward position while you sleep (night splint).  Physical therapy to relieve symptoms and prevent problems in the future.  Injections of steroid medicine (cortisone) to relieve pain and inflammation.  Stimulating your plantar fascia with electrical impulses (extracorporeal shock wave therapy). This is usually the last treatment option before surgery.  Surgery, if other treatments have not worked after 12 months.  Follow these instructions at home:      Managing pain, stiffness, and swelling  If directed, put ice on the painful area:  Put ice in a plastic bag, or use a frozen bottle of water.  Place a towel between your skin and the bag or bottle.  Roll the bottom of your foot over the bag or bottle.  Do this for 20 minutes, 2-3 times a day.  Wear athletic shoes that have air-sole or gel-sole cushions, or try wearing soft shoe inserts that are designed for plantar fasciitis.  Raise (elevate) your foot above the level of your heart while you are sitting or lying down.  Activity  Avoid activities that cause pain.   Ask your health care provider what activities are safe for you.  Do physical therapy exercises and stretches as told by your health care provider.  Try activities and forms of exercise that are easier on your joints (low-impact). Examples include swimming, water aerobics, and biking.  General instructions  Take over-the-counter and prescription medicines only as told by your health care provider.  Wear a night splint while sleeping, if told by your health care provider. Loosen the splint if your toes tingle, become numb, or turn cold and  blue.  Maintain a healthy weight, or work with your health care provider to lose weight as needed.  Keep all follow-up visits as told by your health care provider. This is important.  Contact a health care provider if you:  Have symptoms that do not go away after caring for yourself at home.  Have pain that gets worse.  Have pain that affects your ability to move or do your daily activities.  Summary  Plantar fasciitis is a painful foot condition that affects the heel. It occurs when the band of tissue that connects the toes to the heel bone (plantar fascia) becomes irritated.  The main symptom of this condition is heel pain that may be worse after exercising too much or standing still for a long time.  Treatment varies, but it usually starts with rest, ice, compression, and elevation (RICE therapy) and over-the-counter medicines to manage pain.  This information is not intended to replace advice given to you by your health care provider. Make sure you discuss any questions you have with your health care provider.  Document Released: 02/28/2001 Document Revised: 04/02/2017 Document Reviewed: 04/02/2017  Elsevier Interactive Patient Education © 2019 [employer] Inc[employer]

## 2019-01-17 ENCOUNTER — Encounter: Payer: Self-pay | Admitting: Family Medicine

## 2019-01-17 ENCOUNTER — Other Ambulatory Visit: Payer: Self-pay

## 2019-01-17 ENCOUNTER — Ambulatory Visit (INDEPENDENT_AMBULATORY_CARE_PROVIDER_SITE_OTHER): Payer: Medicare Other | Admitting: Family Medicine

## 2019-01-17 VITALS — BP 120/80 | HR 90 | Temp 96.8°F | Resp 16 | Ht 60.0 in | Wt 165.0 lb

## 2019-01-17 DIAGNOSIS — R220 Localized swelling, mass and lump, head: Secondary | ICD-10-CM | POA: Diagnosis not present

## 2019-01-17 NOTE — Progress Notes (Signed)
Name: [name]   MRN: [id number]    DOB: 06-29-1980   Date:01/17/2019       Progress Note  Subjective  Chief Complaint  Chief Complaint  Patient presents with  . Mass    Lump behind her left ear.     HPI  Knot behind left ear: she had a telemedicine visit with me in December 12, 2018 and we treated her with antibiotics - Augmenting for 10 days, she took medication as prescribed and knot did not go down. She denies ear pain, scalp lesions, tooth problems. Size goes up and down, no redness or increase in warmth. Explained likely a cyst, we will refer her to ENT   Patient Active Problem List   Diagnosis Date Noted  . S/P cesarean section 01/13/2013  . Bilateral deafness 01/12/2013    Past Surgical History:  Procedure Laterality Date  . CESAREAN SECTION    . CESAREAN SECTION N/A 01/13/2013   Procedure: REPEAT CESAREAN SECTION;  Surgeon: Thornell Sartorius, MD;  Location: [facility];  Service: Obstetrics;  Laterality: N/A;  Jamie from office called and states pt is deaf and needs interpreter on day of surgery.  EDA Royal called and intrepreter scheduled for day of surgery.  . CESAREAN SECTION N/A 06/28/2015   Procedure: CESAREAN SECTION;  Surgeon: Janyth Contes, MD;  Location: [facility];  Service: Obstetrics;  Laterality: N/A;  . WISDOM TOOTH EXTRACTION      Family History  Problem Relation Age of Onset  . Breast cancer Mother   . Diabetes Father   . Hypertension Brother   . Breast cancer Paternal Grandmother   . Breast cancer Maternal Aunt   . Heart attack Paternal Aunt   . Prostate cancer Paternal Uncle     Social History   Socioeconomic History  . Marital status: Married    Spouse name: Nayda Riesen  . Number of children: 3  . Years of education: Not on file  . Highest education level: Associate degree: academic program  Occupational History  . Occupation: disabled     Comment: hearing  Social Needs  . Financial resource strain: Not very hard  . Food insecurity    Worry:  Sometimes true    Inability: Sometimes true  . Transportation needs    Medical: No    Non-medical: No  Tobacco Use  . Smoking status: Never Smoker  . Smokeless tobacco: Never Used  Substance and Sexual Activity  . Alcohol use: Not Currently    Alcohol/week: 1.0 standard drinks    Types: 1 Glasses of wine per week  . Drug use: Never  . Sexual activity: Yes    Partners: Male    Birth control/protection: I.U.D.  Lifestyle  . Physical activity    Days per week: 0 days    Minutes per session: 0 min  . Stress: To some extent  Relationships  . Social connections    Talks on phone: More than three times a week    Gets together: Three times a week    Attends religious service: Never    Active member of club or organization: No    Attends meetings of clubs or organizations: Never    Relationship status: Married  . Intimate partner violence    Fear of current or ex partner: No    Emotionally abused: No    Physically abused: No    Forced sexual activity: No  Other Topics Concern  . Not on file  Social History Narrative  Married with 3 children, stay at home with her children     Current Outpatient Medications:  .  amoxicillin-clavulanate (AUGMENTIN) 875-125 MG tablet, Take 1 tablet by mouth 2 (two) times daily., Disp: 20 tablet, Rfl: 0 .  levonorgestrel (MIRENA) 20 MCG/24HR IUD, 1 each by Intrauterine route once., Disp: , Rfl:   Allergies  Allergen Reactions  . Nickel Rash    I personally reviewed active problem list, medication list, allergies, family history, social history with the patient/caregiver today.   ROS  Ten systems reviewed and is negative except as mentioned in HPI    Objective  Vitals:   01/17/19 1036  BP: 120/80  Pulse: 90  Resp: 16  Temp: (!) 96.8 F (36 C)  TempSrc: Temporal  SpO2: 98%  Weight: 165 lb (74.8 kg)  Height: 5' (1.524 m)    Body mass index is 32.22 kg/m.  Physical Exam  Constitutional: Patient appears well-developed and  well-nourished. Obese  No distress.  HEENT: head atraumatic, normocephalic, pupils equal and reactive to light, ears normal TM, smaller than a dime in size, soft behind left ear, no redness or increase in warmth  Cardiovascular: Normal rate, regular rhythm and normal heart sounds.  No murmur heard. No BLE edema. Pulmonary/Chest: Effort normal and breath sounds normal. No respiratory distress. Abdominal: Soft.  There is no tenderness. Psychiatric: Patient has a normal mood and affect. behavior is normal. Judgment and thought content normal.   PHQ2/9: Depression screen [facility] 2/9 01/17/2019 12/12/2018 09/05/2018 02/06/2018 07/04/2017  Decreased Interest 0 0 2 0 0  Down, Depressed, Hopeless 0 0 0 0 0  PHQ - 2 Score 0 0 2 0 0  Altered sleeping 0 0 0 0 -  Tired, decreased energy 0 0 1 0 -  Change in appetite 0 0 0 0 -  Feeling bad or failure about yourself  0 0 0 0 -  Trouble concentrating 0 0 0 0 -  Moving slowly or fidgety/restless 0 0 0 0 -  Suicidal thoughts 0 0 0 0 -  PHQ-9 Score 0 0 3 0 -  Difficult doing work/chores - Not difficult at all Somewhat difficult Not difficult at all -    phq 9 is negative   Fall Risk: Fall Risk  01/17/2019 12/12/2018 09/05/2018 02/06/2018 07/04/2017  Falls in the past year? 0 0 Exclusion - non ambulatory No No  Number falls in past yr: 0 0 0 - -  Injury with Fall? 0 0 0 - -  Follow up - - Falls prevention discussed - -     Functional Status Survey: Is the patient deaf or have difficulty hearing?: No Does the patient have difficulty seeing, even when wearing glasses/contacts?: No Does the patient have difficulty concentrating, remembering, or making decisions?: No Does the patient have difficulty walking or climbing stairs?: No Does the patient have difficulty dressing or bathing?: No Does the patient have difficulty doing errands alone such as visiting a doctor's office or shopping?: No    Assessment & Plan  1. Mass of scalp  Located behind left ear,  unable to find diagnosis on Epic, referral placed to ENT

## 2019-01-20 DIAGNOSIS — R102 Pelvic and perineal pain: Secondary | ICD-10-CM | POA: Diagnosis not present

## 2019-01-20 DIAGNOSIS — T8332XA Displacement of intrauterine contraceptive device, initial encounter: Secondary | ICD-10-CM | POA: Diagnosis not present

## 2019-01-22 ENCOUNTER — Telehealth: Payer: Self-pay

## 2019-01-22 DIAGNOSIS — R220 Localized swelling, mass and lump, head: Secondary | ICD-10-CM

## 2019-01-22 NOTE — Telephone Encounter (Signed)
Copied from [place] [id number]. Topic: Referral - Status >> Jan 22, 2019 12:03 PM Virl Axe D wrote: Reason for CRM: Pt stated [facility] did not receive referral when she spoke with them today. Requesting it be resent as soon as possible. Please advise.   There is a note in [facility] that this patient declined referral and that she needs dermatology.  "Rejection Reason - Other - please send to dermatology" [place] [place] [facility] said 2 days ago   Please advise

## 2019-02-16 ENCOUNTER — Other Ambulatory Visit: Payer: Self-pay

## 2019-02-16 ENCOUNTER — Emergency Department (HOSPITAL_COMMUNITY)
Admission: EM | Admit: 2019-02-16 | Discharge: 2019-02-16 | Disposition: A | Discharge status: Home / Self Care | Payer: Medicare Other | Attending: Emergency Medicine | Admitting: Emergency Medicine

## 2019-02-16 ENCOUNTER — Encounter (HOSPITAL_COMMUNITY): Payer: Self-pay | Admitting: Emergency Medicine

## 2019-02-16 DIAGNOSIS — H9190 Unspecified hearing loss, unspecified ear: Secondary | ICD-10-CM | POA: Insufficient documentation

## 2019-02-16 DIAGNOSIS — R682 Dry mouth, unspecified: Secondary | ICD-10-CM | POA: Diagnosis not present

## 2019-02-16 LAB — CBC WITH DIFFERENTIAL/PLATELET
Abs Immature Granulocytes: 0.03 10*3/uL (ref 0.00–0.07)
Basophils Absolute: 0 10*3/uL (ref 0.0–0.1)
Basophils Relative: 0 %
Eosinophils Absolute: 0.1 10*3/uL (ref 0.0–0.5)
Eosinophils Relative: 1 %
HCT: 37.5 % (ref 36.0–46.0)
Hemoglobin: 12.2 g/dL (ref 12.0–15.0)
Immature Granulocytes: 0 %
Lymphocytes Relative: 13 %
Lymphs Abs: 1.2 10*3/uL (ref 0.7–4.0)
MCH: 31.3 pg (ref 26.0–34.0)
MCHC: 32.5 g/dL (ref 30.0–36.0)
MCV: 96.2 fL (ref 80.0–100.0)
Monocytes Absolute: 0.6 10*3/uL (ref 0.1–1.0)
Monocytes Relative: 7 %
Neutro Abs: 7.1 10*3/uL (ref 1.7–7.7)
Neutrophils Relative %: 79 %
Platelets: 233 10*3/uL (ref 150–400)
RBC: 3.9 MIL/uL (ref 3.87–5.11)
RDW: 12.4 % (ref 11.5–15.5)
WBC: 9 10*3/uL (ref 4.0–10.5)
nRBC: 0 % (ref 0.0–0.2)

## 2019-02-16 LAB — COMPREHENSIVE METABOLIC PANEL
ALT: 16 U/L (ref 0–44)
AST: 15 U/L (ref 15–41)
Albumin: 3.9 g/dL (ref 3.5–5.0)
Alkaline Phosphatase: 49 U/L (ref 38–126)
Anion gap: 12 (ref 5–15)
BUN: 10 mg/dL (ref 6–20)
CO2: 22 mmol/L (ref 22–32)
Calcium: 8.9 mg/dL (ref 8.9–10.3)
Chloride: 101 mmol/L (ref 98–111)
Creatinine, Ser: 0.75 mg/dL (ref 0.44–1.00)
GFR calc Af Amer: >60 mL/min (ref >60)
GFR calc non Af Amer: >60 mL/min (ref >60)
Glucose, Bld: 128 mg/dL — ABNORMAL HIGH (ref 70–99)
Potassium: 3.3 mmol/L — ABNORMAL LOW (ref 3.5–5.1)
Sodium: 135 mmol/L (ref 135–145)
Total Bilirubin: 0.4 mg/dL (ref 0.3–1.2)
Total Protein: 6.6 g/dL (ref 6.5–8.1)

## 2019-02-16 LAB — URINALYSIS, ROUTINE W REFLEX MICROSCOPIC
Bilirubin Urine: NEGATIVE
Glucose, UA: NEGATIVE mg/dL
Hgb urine dipstick: NEGATIVE
Ketones, ur: NEGATIVE mg/dL
Leukocytes,Ua: NEGATIVE
Nitrite: NEGATIVE
Protein, ur: NEGATIVE mg/dL
Specific Gravity, Urine: 1.005 (ref 1.005–1.030)
pH: 6 (ref 5.0–8.0)

## 2019-02-16 LAB — I-STAT BETA HCG BLOOD, ED (MC, WL, AP ONLY): I-stat hCG, quantitative: <5 m[IU]/mL (ref <5)

## 2019-02-16 NOTE — Discharge Instructions (Addendum)
You were seen today for dry mouth.  Your symptoms completely resolved and your physical exam is normal.

## 2019-02-16 NOTE — ED Provider Notes (Signed)
[facility] EMERGENC[facility] DEPARTMENT Provider Note   CSN: [id number] Arrival date & time: 02/16/19  0100     History   Chief Complaint Chief Complaint  Patient presents with  . Dry Mouth    HPI [name] is a 38 y.o. female.     HPI  This is a 38 year old female who is deaf who presents with a dry mouth.  Patient reports that she noted intense dry mouth around 11 PM.  She believes she has stayed hydrated and eating well.  She denies any recent illnesses, fevers, cough, shortness of breath.  She has never had a pain like this before.  Symptoms self resolved and she now states that she feels better.  No recent medication changes.  History taken with sign language interpreter.  Past Medical History:  Diagnosis Date  . Deafness   . Gastroesophageal reflux in pregancy   . GERD (gastroesophageal reflux disease)   . IUD (intrauterine device) in place 08/11/2015  . Normal pregnancy, repeat 01/12/2013  . Normal pregnancy, repeat 06/27/2015  . PONV (postoperative nausea and vomiting)   . S/P cesarean section 01/13/2013    Patient Active Problem List   Diagnosis Date Noted  . S/P cesarean section 01/13/2013  . Bilateral deafness 01/12/2013    Past Surgical History:  Procedure Laterality Date  . CESAREAN SECTION    . CESAREAN SECTION N/A 01/13/2013   Procedure: REPEAT CESAREAN SECTION;  Surgeon: Thornell Sartorius, MD;  Location: [facility];  Service: Obstetrics;  Laterality: N/A;  Jamie from office called and states pt is deaf and needs interpreter on day of surgery.  EDA Royal called and intrepreter scheduled for day of surgery.  . CESAREAN SECTION N/A 06/28/2015   Procedure: CESAREAN SECTION;  Surgeon: Janyth Contes, MD;  Location: [facility];  Service: Obstetrics;  Laterality: N/A;  . WISDOM TOOTH EXTRACTION       OB History    Gravida  3   Para  3   Term  3   Preterm      AB      Living  2     SAB      TAB      Ectopic      Multiple  0   Live  Births  2            Home Medications    Prior to Admission medications   Medication Sig Start Date End Date Taking? Authorizing Provider  amoxicillin-clavulanate (AUGMENTIN) 875-125 MG tablet Take 1 tablet by mouth 2 (two) times daily. 12/12/18   Steele Sizer, MD  levonorgestrel (MIRENA) 20 MCG/24HR IUD 1 each by Intrauterine route once.    [provider]    Family History Family History  Problem Relation Age of Onset  . Breast cancer Mother   . Diabetes Father   . Hypertension Brother   . Breast cancer Paternal Grandmother   . Breast cancer Maternal Aunt   . Heart attack Paternal Aunt   . Prostate cancer Paternal Uncle     Social History Social History   Tobacco Use  . Smoking status: Never Smoker  . Smokeless tobacco: Never Used  Substance Use Topics  . Alcohol use: Not Currently    Alcohol/week: 1.0 standard drinks    Types: 1 Glasses of wine per week  . Drug use: Never     Allergies   Nickel   Review of Systems Review of Systems  Constitutional: Negative for fever.  HENT: Negative  for mouth sores.        Dry mouth  Respiratory: Negative for shortness of breath.   Cardiovascular: Negative for chest pain.  Gastrointestinal: Negative for abdominal pain, nausea and vomiting.  All other systems reviewed and are negative.    Physical Exam Updated Vital Signs BP 125/68 (BP Location: Right Arm)   Pulse 93   Temp 98.5 F (36.9 C) (Oral)   Resp 18   Ht 1.524 m (5')   Wt 72.6 kg   SpO2 100%   BMI 31.25 kg/m   Physical Exam Vitals signs and nursing note reviewed.  Constitutional:      Appearance: She is well-developed.  HENT:     Head: Normocephalic and atraumatic.     Nose: Nose normal.     Mouth/Throat:     Comments: Mouth moist, no lesions noted, no bleeding, no trismus or ulcerations Eyes:     Pupils: Pupils are equal, round, and reactive to light.  Cardiovascular:     Rate and Rhythm: Normal rate and regular rhythm.      Heart sounds: Normal heart sounds.  Pulmonary:     Effort: Pulmonary effort is normal. No respiratory distress.     Breath sounds: No wheezing.  Abdominal:     General: Bowel sounds are normal.     Palpations: Abdomen is soft.     Tenderness: There is no abdominal tenderness.  Skin:    General: Skin is warm and dry.  Neurological:     Mental Status: She is alert and oriented to person, place, and time.  Psychiatric:        Mood and Affect: Mood normal.      ED Treatments / Results  Labs (all labs ordered are listed, but only abnormal results are displayed) Labs Reviewed  COMPREHENSIVE METABOLIC PANEL - Abnormal; Notable for the following components:      Result Value   Potassium 3.3 (*)    Glucose, Bld 128 (*)    All other components within normal limits  URINALYSIS, ROUTINE W REFLEX MICROSCOPIC - Abnormal; Notable for the following components:   Color, Urine STRAW (*)    All other components within normal limits  CBC WITH DIFFERENTIAL/PLATELET  I-STAT BETA HCG BLOOD, ED ([facility], [facility], [facility] ONLY)    EKG None  Radiology No results found.  Procedures Procedures (including critical care time)  Medications Ordered in ED Medications - No data to display   Initial Impression / Assessment and Plan / ED Course  I have reviewed the triage vital signs and the nursing notes.  Pertinent labs & imaging results that were available during my care of the patient were reviewed by me and considered in my medical decision making (see chart for details).        Patient presents with dry mouth.  Onset for several hours.  This is self resolved.  She is overall nontoxic-appearing and vital signs are reassuring.  Physical exam is completely benign.  Patient is requesting discharge home.  Lab work-up sent from triage reviewed and largely unremarkable.  Unclear etiology for symptoms but patient is well-appearing and does not appear to have an acute emergent process.  After history, exam, and  medical workup I feel the patient has been appropriately medically screened and is safe for discharge home. Pertinent diagnoses were discussed with the patient. Patient was given return precautions.   Final Clinical Impressions(s) / ED Diagnoses   Final diagnoses:  Dry mouth    ED Discharge Orders  None       Merryl Hacker, MD 02/16/19 [phone]

## 2019-02-16 NOTE — ED Notes (Signed)
Patient verbalizes understanding of discharge instructions. Opportunity for questioning and answers were provided. Armband removed by staff, pt discharged from ED home via POV.  

## 2019-02-16 NOTE — ED Triage Notes (Signed)
Patient is deaf, she communicated via pen and paper.  She wrote that she is having dry mouth with some blood in her mouth that started around midnight.  She has never had this before, she states that she is more tired than usual.  She denies any pregnancy.  She states she does have some stress.  Interpreter called for patient.

## 2019-02-18 DIAGNOSIS — R5383 Other fatigue: Secondary | ICD-10-CM | POA: Diagnosis not present

## 2019-02-18 DIAGNOSIS — T8389XA Other specified complication of genitourinary prosthetic devices, implants and grafts, initial encounter: Secondary | ICD-10-CM | POA: Diagnosis not present

## 2019-02-18 DIAGNOSIS — Z30433 Encounter for removal and reinsertion of intrauterine contraceptive device: Secondary | ICD-10-CM | POA: Diagnosis not present

## 2019-02-18 DIAGNOSIS — T8332XD Displacement of intrauterine contraceptive device, subsequent encounter: Secondary | ICD-10-CM | POA: Diagnosis not present

## 2019-02-18 HISTORY — PX: INTRAUTERINE DEVICE (IUD) INSERTION: SHX5877

## 2019-02-19 ENCOUNTER — Emergency Department (HOSPITAL_COMMUNITY)
Admission: EM | Admit: 2019-02-19 | Discharge: 2019-02-20 | Disposition: A | Discharge status: Home / Self Care | Payer: Medicare Other | Attending: Emergency Medicine | Admitting: Emergency Medicine

## 2019-02-19 ENCOUNTER — Encounter: Payer: Self-pay | Admitting: Family Medicine

## 2019-02-19 ENCOUNTER — Encounter (HOSPITAL_COMMUNITY): Payer: Self-pay | Admitting: Emergency Medicine

## 2019-02-19 DIAGNOSIS — F439 Reaction to severe stress, unspecified: Secondary | ICD-10-CM | POA: Diagnosis not present

## 2019-02-19 DIAGNOSIS — Z733 Stress, not elsewhere classified: Secondary | ICD-10-CM | POA: Diagnosis not present

## 2019-02-19 DIAGNOSIS — R42 Dizziness and giddiness: Secondary | ICD-10-CM | POA: Diagnosis not present

## 2019-02-19 LAB — TSH: TSH: 1.18 (ref 0.41–5.90)

## 2019-02-19 MED ORDER — SODIUM CHLORIDE 0.9% FLUSH: 3.0000 mL | Freq: Once | INTRAVENOUS | Status: DC

## 2019-02-19 NOTE — ED Triage Notes (Signed)
Patient is deaf, her with mother as interpreter.  She is having continued nausea, dizziness that she has had for 4 days now.  She denies pregnancy, she has not been exposed to Covid.  No diarrhea, no vomiting.

## 2019-02-20 ENCOUNTER — Other Ambulatory Visit: Payer: Self-pay

## 2019-02-20 ENCOUNTER — Encounter: Payer: Self-pay | Admitting: Family Medicine

## 2019-02-20 DIAGNOSIS — F439 Reaction to severe stress, unspecified: Secondary | ICD-10-CM | POA: Diagnosis not present

## 2019-02-20 LAB — BASIC METABOLIC PANEL
Anion gap: 11 (ref 5–15)
BUN: 5 mg/dL — ABNORMAL LOW (ref 6–20)
CO2: 24 mmol/L (ref 22–32)
Calcium: 9.3 mg/dL (ref 8.9–10.3)
Chloride: 103 mmol/L (ref 98–111)
Creatinine, Ser: 0.66 mg/dL (ref 0.44–1.00)
GFR calc Af Amer: >60 mL/min (ref >60)
GFR calc non Af Amer: >60 mL/min (ref >60)
Glucose, Bld: 101 mg/dL — ABNORMAL HIGH (ref 70–99)
Potassium: 3.3 mmol/L — ABNORMAL LOW (ref 3.5–5.1)
Sodium: 138 mmol/L (ref 135–145)

## 2019-02-20 LAB — I-STAT BETA HCG BLOOD, ED (MC, WL, AP ONLY): I-stat hCG, quantitative: <5 m[IU]/mL (ref <5)

## 2019-02-20 LAB — URINALYSIS, ROUTINE W REFLEX MICROSCOPIC
Bilirubin Urine: NEGATIVE
Glucose, UA: NEGATIVE mg/dL
Hgb urine dipstick: NEGATIVE
Ketones, ur: 20 mg/dL — AB
Leukocytes,Ua: NEGATIVE
Nitrite: NEGATIVE
Protein, ur: NEGATIVE mg/dL
Specific Gravity, Urine: 1.012 (ref 1.005–1.030)
pH: 6 (ref 5.0–8.0)

## 2019-02-20 LAB — CBC
HCT: 39.2 % (ref 36.0–46.0)
Hemoglobin: 12.6 g/dL (ref 12.0–15.0)
MCH: 30.7 pg (ref 26.0–34.0)
MCHC: 32.1 g/dL (ref 30.0–36.0)
MCV: 95.4 fL (ref 80.0–100.0)
Platelets: 245 10*3/uL (ref 150–400)
RBC: 4.11 MIL/uL (ref 3.87–5.11)
RDW: 12.2 % (ref 11.5–15.5)
WBC: 9.7 10*3/uL (ref 4.0–10.5)
nRBC: 0 % (ref 0.0–0.2)

## 2019-02-20 NOTE — Discharge Instructions (Addendum)
Your bodily symptoms, confusion, and distraction, all seem to be related to stress.  Your blood pressure is not technically elevated, at a point needing treatment.  Your blood pressure will likely improve to lower levels, when your stress gets better.  It is important to continue to help your children with their school work, and seek resources, through the school to help you with homework issues.  We are supplying a [employer], that can help you find a counselor to see for further stress management, for yourself.  Your primary care doctor can also refer you and help you see a counselor.

## 2019-02-20 NOTE — ED Provider Notes (Signed)
[facility] EMERGENCY DEPARTMENT Provider Note   CSN: [id number] Arrival date & time: 02/19/19  2329     History   Chief Complaint Chief Complaint  Patient presents with  . Dizziness    HPI [name] is a 38 y.o. female.     HPI   She presents for evaluation of stress which she is suspecting is making her blood pressure high.  She is worried that stress might cause her to die.  She is interviewed, using her mother as her sign language interpreter, at her request.  Patient was in the ED,5 days ago complaining of dry mouth.  She reportedly still has a dry mouth.  The patient cites the stress of parenting 2 children who are in virtual school, ages 40 and 62.  She also has a 60-year-old at home.  Her husband is also deaf, they all live together as a family.  The patient denies persistent headache, blurred vision, change in appetite, chest pain, shortness of breath, abdominal pain, back pain, dysuria, diarrhea, focal weakness or paresthesia.  He does not describe a sensation of vertigo.  No prior similar problems.  She has an upcoming appointment with her primary care doctor,  tomorrow.    Past Medical History:  Diagnosis Date  . Deafness   . Gastroesophageal reflux in pregancy   . GERD (gastroesophageal reflux disease)   . IUD (intrauterine device) in place 08/11/2015  . Normal pregnancy, repeat 01/12/2013  . Normal pregnancy, repeat 06/27/2015  . PONV (postoperative nausea and vomiting)   . S/P cesarean section 01/13/2013    Patient Active Problem List   Diagnosis Date Noted  . S/P cesarean section 01/13/2013  . Bilateral deafness 01/12/2013    Past Surgical History:  Procedure Laterality Date  . CESAREAN SECTION    . CESAREAN SECTION N/A 01/13/2013   Procedure: REPEAT CESAREAN SECTION;  Surgeon: Thornell Sartorius, MD;  Location: [facility];  Service: Obstetrics;  Laterality: N/A;  Jamie from office called and states pt is deaf and needs interpreter on day of  surgery.  EDA Royal called and intrepreter scheduled for day of surgery.  . CESAREAN SECTION N/A 06/28/2015   Procedure: CESAREAN SECTION;  Surgeon: Janyth Contes, MD;  Location: [facility];  Service: Obstetrics;  Laterality: N/A;  . INTRAUTERINE DEVICE (IUD) INSERTION  02/18/2019  . WISDOM TOOTH EXTRACTION       OB History    Gravida  3   Para  3   Term  3   Preterm      AB      Living  2     SAB      TAB      Ectopic      Multiple  0   Live Births  2            Home Medications    Prior to Admission medications   Medication Sig Start Date End Date Taking? Authorizing Provider  amoxicillin-clavulanate (AUGMENTIN) 875-125 MG tablet Take 1 tablet by mouth 2 (two) times daily. 12/12/18   Steele Sizer, MD  levonorgestrel (MIRENA) 20 MCG/24HR IUD 1 each by Intrauterine route once.    [provider]    Family History Family History  Problem Relation Age of Onset  . Breast cancer Mother   . Diabetes Father   . Hypertension Brother   . Breast cancer Paternal Grandmother   . Breast cancer Maternal Aunt   . Heart attack Paternal Aunt   .  Prostate cancer Paternal Uncle     Social History Social History   Tobacco Use  . Smoking status: Never Smoker  . Smokeless tobacco: Never Used  Substance Use Topics  . Alcohol use: Not Currently    Alcohol/week: 1.0 standard drinks    Types: 1 Glasses of wine per week  . Drug use: Never     Allergies   Nickel   Review of Systems Review of Systems  All other systems reviewed and are negative.    Physical Exam Updated Vital Signs BP (!) 142/90 (BP Location: Right Arm)   Pulse 77   Temp 99 F (37.2 C) (Oral)   Resp 16   SpO2 98%   Physical Exam Vitals signs and nursing note reviewed.  Constitutional:      General: She is not in acute distress.    Appearance: Normal appearance. She is well-developed. She is not ill-appearing, toxic-appearing or diaphoretic.  HENT:     Head: Normocephalic  and atraumatic.     Right Ear: External ear normal.     Left Ear: External ear normal.  Eyes:     Conjunctiva/sclera: Conjunctivae normal.     Pupils: Pupils are equal, round, and reactive to light.  Neck:     Musculoskeletal: Normal range of motion and neck supple.     Trachea: Phonation normal.  Cardiovascular:     Rate and Rhythm: Normal rate and regular rhythm.     Heart sounds: Normal heart sounds.  Pulmonary:     Effort: Pulmonary effort is normal.     Breath sounds: Normal breath sounds.  Abdominal:     Palpations: Abdomen is soft.     Tenderness: There is no abdominal tenderness.  Musculoskeletal: Normal range of motion.  Skin:    General: Skin is warm and dry.  Neurological:     Mental Status: She is alert and oriented to person, place, and time.     Sensory: No sensory deficit.     Motor: No abnormal muscle tone.     Coordination: Coordination normal.  Psychiatric:        Mood and Affect: Mood normal.        Behavior: Behavior normal.        Thought Content: Thought content normal.        Judgment: Judgment normal.      ED Treatments / Results  Labs (all labs ordered are listed, but only abnormal results are displayed) Labs Reviewed  BASIC METABOLIC PANEL - Abnormal; Notable for the following components:      Result Value   Potassium 3.3 (*)    Glucose, Bld 101 (*)    BUN 5 (*)    All other components within normal limits  URINALYSIS, ROUTINE W REFLEX MICROSCOPIC - Abnormal; Notable for the following components:   Ketones, ur 20 (*)    All other components within normal limits  CBC  I-STAT BETA HCG BLOOD, ED ([facility], [facility], [facility] ONLY)    EKG EKG Interpretation  Date/Time:  Thursday February 20 2019 08:11:56 EDT Ventricular Rate:  104 PR Interval:    QRS Duration: 82 QT Interval:  355 QTC Calculation: 467 R Axis:   31 Text Interpretation:  Sinus tachycardia Right atrial enlargement Low voltage, precordial leads Abnormal R-wave progression, early  transition Since last tracing rate faster Confirmed by Daleen Bo [phone]) on 02/20/2019 8:41:34 AM   Radiology No results found.  Procedures Procedures (including critical care time)  Medications Ordered in ED Medications  sodium  chloride flush (NS) 0.9 % injection 3 mL (has no administration in time range)     Initial Impression / Assessment and Plan / ED Course  I have reviewed the triage vital signs and the nursing notes.  Pertinent labs & imaging results that were available during my care of the patient were reviewed by me and considered in my medical decision making (see chart for details).  Clinical Course as of Feb 20 927  Thu Feb 20, 2019  0812 normal  I-Stat beta hCG blood, ED [EW]  [id number] normal  CBC [EW]  [id number] Normal except potassium low  Basic metabolic panel(!) [EW]  0000000 normal  Urinalysis, Routine w reflex microscopic(!) [EW]  0832 MCV: 95.4 [EW]  0920 Patient on discussions with patient and mother, regarding her symptoms.  Patient has generalized weakness, confusion, sleeplessness, exhaustion, and difficulty keeping up with the constraints of her children being at home with her all the time.  Her husband is very tries to help but he has less capability of these test than she does.  Had a long discussion with the patient using her mother as Optometrist and discussed importance of stress management to control mood, physical symptoms and blood pressure.  Patient seems agreeable to meeting with a counselor, and plans on seeing her PCP tomorrow, and will discuss some of these issues.  I will also give a resource guide to help with seeing a counselor.   [EW]    Clinical Course User Index [EW] Daleen Bo, MD        Patient Vitals for the past 24 hrs:  BP Temp Temp src Pulse Resp SpO2  02/20/19 0921 (!) 142/90 - - 77 16 98 %  02/20/19 0832 133/72 - - (!) 102 19 99 %  02/20/19 0516 123/77 99 F (37.2 C) Oral 87 18 100 %  02/20/19 0243 (!) 151/91 99.3 F (37.4  C) - (!) 103 17 100 %  02/19/19 2340 130/65 99.2 F (37.3 C) Oral 86 16 99 %    9:22 AM Reevaluation with update and discussion. After initial assessment and treatment, an updated evaluation reveals no change in clinical status, orthostatics negative.  Findings discussed with patient and mother, all questions answered. Daleen Bo   Medical Decision Making: Somatic complaints related to stress.  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  Screening evaluation does not indicate uncontrolled blood pressure, metabolic instability or suggestion for impending vascular collapse.  Doubt acute CNS abnormality.  CRITICAL CARE-no Performed by: Daleen Bo  Nursing Notes Reviewed/ Care Coordinated Applicable Imaging Reviewed Interpretation of Laboratory Data incorporated into ED treatment  The patient appears reasonably screened and/or stabilized for discharge and I doubt any other medical condition or other [facility] requiring further screening, evaluation, or treatment in the ED at this time prior to discharge.  Plan: Home Medications-OTC as needed; Home Treatments-rest, stress management; return here if the recommended treatment, does not improve the symptoms; Recommended follow up-PCP tomorrow and counselor soon as possible.   Final Clinical Impressions(s) / ED Diagnoses   Final diagnoses:  Stress    ED Discharge Orders    None       Daleen Bo, MD 02/20/19 [phone]

## 2019-02-20 NOTE — ED Notes (Signed)
Pt verbalized understanding of discharge paperwork and follow-up care.  °

## 2019-02-20 NOTE — ED Notes (Signed)
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

## 2019-02-21 ENCOUNTER — Ambulatory Visit: Payer: Medicare Other | Admitting: Family Medicine

## 2019-02-27 ENCOUNTER — Ambulatory Visit: Payer: Medicare Other | Admitting: Family Medicine

## 2019-03-05 ENCOUNTER — Encounter: Payer: Self-pay | Admitting: Family Medicine

## 2019-03-05 ENCOUNTER — Ambulatory Visit (INDEPENDENT_AMBULATORY_CARE_PROVIDER_SITE_OTHER): Payer: Medicare Other | Admitting: Family Medicine

## 2019-03-05 ENCOUNTER — Other Ambulatory Visit: Payer: Self-pay

## 2019-03-05 ENCOUNTER — Other Ambulatory Visit (HOSPITAL_COMMUNITY)
Admission: RE | Admit: 2019-03-05 | Discharge: 2019-03-05 | Disposition: A | Discharge status: Home / Self Care | Payer: Medicare Other | Source: Ambulatory Visit | Attending: Family Medicine | Admitting: Family Medicine

## 2019-03-05 VITALS — BP 104/50 | HR 82 | Temp 97.7°F | Resp 16 | Ht 60.0 in | Wt 157.4 lb

## 2019-03-05 DIAGNOSIS — Z23 Encounter for immunization: Secondary | ICD-10-CM

## 2019-03-05 DIAGNOSIS — Z124 Encounter for screening for malignant neoplasm of cervix: Secondary | ICD-10-CM

## 2019-03-05 DIAGNOSIS — R220 Localized swelling, mass and lump, head: Secondary | ICD-10-CM | POA: Diagnosis not present

## 2019-03-05 DIAGNOSIS — F419 Anxiety disorder, unspecified: Secondary | ICD-10-CM | POA: Diagnosis not present

## 2019-03-05 MED ORDER — HYDROXYZINE HCL 10 MG PO TABS: 10.0000 mg | ORAL_TABLET | Freq: Three times a day (TID) | ORAL | 0 refills | Status: DC | PRN

## 2019-03-05 NOTE — Progress Notes (Signed)
Name: [name]   MRN: [id number]    DOB: Jul 30, 1980   Date:03/05/2019       Progress Note  Subjective  Chief Complaint  Chief Complaint  Patient presents with  . Gynecologic Exam    Repeat pap smear  . Stress    HPI  Stress: she is struggling since having both kids 36 yo and 71 yo at home because of COVID-19 and remote learning. She is hearing impaired and came in with her mother as an interpreter today, she went [facility] on [facility]/16/2019 very anxious and nervous with chest pain and bp was elevated , GAD positive, negative Phq9   Auricular mass: we placed referral to ENT but they recommended dermatologist instead, referral was placed but she never got an appointment . We will verify what happened , she lives in [place] and we will try to refer her to a place closer to her house . We got her appointment for [facility] for tomorrow am, but she states too far from her house   Patient Active Problem List   Diagnosis Date Noted  . S/P cesarean section 01/13/2013  . Bilateral deafness 01/12/2013    Past Surgical History:  Procedure Laterality Date  . CESAREAN SECTION    . CESAREAN SECTION N/A 01/13/2013   Procedure: REPEAT CESAREAN SECTION;  Surgeon: Thornell Sartorius, MD;  Location: [facility];  Service: Obstetrics;  Laterality: N/A;  Jamie from office called and states pt is deaf and needs interpreter on day of surgery.  EDA Royal called and intrepreter scheduled for day of surgery.  . CESAREAN SECTION N/A 06/28/2015   Procedure: CESAREAN SECTION;  Surgeon: Janyth Contes, MD;  Location: [facility];  Service: Obstetrics;  Laterality: N/A;  . INTRAUTERINE DEVICE (IUD) INSERTION  02/18/2019  . WISDOM TOOTH EXTRACTION      Family History  Problem Relation Age of Onset  . Breast cancer Mother   . Diabetes Father   . Hypertension Brother   . Breast cancer Paternal Grandmother   . Breast cancer Maternal Aunt   . Heart attack Paternal Aunt   . Prostate cancer Paternal Uncle      Social History   Socioeconomic History  . Marital status: Married    Spouse name: Shamiracle Wratten  . Number of children: 3  . Years of education: Not on file  . Highest education level: Associate degree: academic program  Occupational History  . Occupation: disabled     Comment: hearing  Social Needs  . Financial resource strain: Not very hard  . Food insecurity    Worry: Sometimes true    Inability: Sometimes true  . Transportation needs    Medical: No    Non-medical: No  Tobacco Use  . Smoking status: Never Smoker  . Smokeless tobacco: Never Used  Substance and Sexual Activity  . Alcohol use: Not Currently    Alcohol/week: 1.0 standard drinks    Types: 1 Glasses of wine per week  . Drug use: Never  . Sexual activity: Yes    Partners: Male    Birth control/protection: I.U.D.  Lifestyle  . Physical activity    Days per week: 0 days    Minutes per session: 0 min  . Stress: To some extent  Relationships  . Social connections    Talks on phone: More than three times a week    Gets together: Three times a week    Attends religious service: Never    Active member of club  or organization: No    Attends meetings of clubs or organizations: Never    Relationship status: Married  . Intimate partner violence    Fear of current or ex partner: No    Emotionally abused: No    Physically abused: No    Forced sexual activity: No  Other Topics Concern  . Not on file  Social History Narrative   Married with 3 children, stay at home with her children     Current Outpatient Medications:  .  levonorgestrel (MIRENA) 20 MCG/24HR IUD, 1 each by Intrauterine route once., Disp: , Rfl:   Allergies  Allergen Reactions  . Nickel Rash    I personally reviewed active problem list, medication list, allergies, family history, social history with the patient/caregiver today.   ROS  Constitutional: Negative for fever or weight change.  Respiratory: Negative for cough and  shortness of breath.   Cardiovascular: Negative for chest pain or palpitations.  Gastrointestinal: Negative for abdominal pain, no bowel changes.  Musculoskeletal: Negative for gait problem or joint swelling.  Skin: Negative for rash.  Neurological: Negative for dizziness or headache.  No other specific complaints in a complete review of systems (except as listed in HPI above).  Objective  Vitals:   03/05/19 1447  BP: (!) 104/50  Pulse: 82  Resp: 16  Temp: 97.7 F (36.5 C)  TempSrc: Temporal  SpO2: 99%  Weight: 157 lb 6.4 oz (71.4 kg)  Height: 5' (1.524 m)    Body mass index is 30.74 kg/m.  Physical Exam  Constitutional: Patient appears well-developed and well-nourished. Obese No distress.  HEENT: head atraumatic, normocephalic, pupils equal and reactive to light, post-auricula mass, non tender neck supple Cardiovascular: Normal rate, regular rhythm and normal heart sounds.  No murmur heard. No BLE edema. Pulmonary/Chest: Effort normal and breath sounds normal. No respiratory distress. Abdominal: Soft.  There is no tenderness. Psychiatric: Patient has a normal mood and affect. behavior is normal. Judgment and thought content normal.  Recent Results (from the past 2160 hour(s))  CBC with Differential     Status: None   Collection Time: 02/16/19  1:50 AM  Result Value Ref Range   WBC 9.0 4.0 - 10.5 K/uL   RBC 3.90 3.87 - 5.11 MIL/uL   Hemoglobin 12.2 12.0 - 15.0 g/dL   HCT 37.5 36.0 - 46.0 %   MCV 96.2 80.0 - 100.0 fL   MCH 31.3 26.0 - 34.0 pg   MCHC 32.5 30.0 - 36.0 g/dL   RDW 12.4 11.5 - 15.5 %   Platelets 233 150 - 400 K/uL   nRBC 0.0 0.0 - 0.2 %   Neutrophils Relative % 79 %   Neutro Abs 7.1 1.7 - 7.7 K/uL   Lymphocytes Relative 13 %   Lymphs Abs 1.2 0.7 - 4.0 K/uL   Monocytes Relative 7 %   Monocytes Absolute 0.6 0.1 - 1.0 K/uL   Eosinophils Relative 1 %   Eosinophils Absolute 0.1 0.0 - 0.5 K/uL   Basophils Relative 0 %   Basophils Absolute 0.0 0.0 - 0.1  K/uL   Immature Granulocytes 0 %   Abs Immature Granulocytes 0.03 0.00 - 0.07 K/uL    Comment: Performed at [facility], [address] [place], [place] [address]  Comprehensive metabolic panel     Status: Abnormal   Collection Time: 02/16/19  1:50 AM  Result Value Ref Range   Sodium 135 135 - 145 mmol/L   Potassium 3.3 (L) 3.5 - 5.1  mmol/L   Chloride 101 98 - 111 mmol/L   CO2 22 22 - 32 mmol/L   Glucose, Bld 128 (H) 70 - 99 mg/dL   BUN 10 6 - 20 mg/dL   Creatinine, Ser 0.75 0.44 - 1.00 mg/dL   Calcium 8.9 8.9 - 10.3 mg/dL   Total Protein 6.6 6.5 - 8.1 g/dL   Albumin 3.9 3.5 - 5.0 g/dL   AST 15 15 - 41 U/L   ALT 16 0 - 44 U/L   Alkaline Phosphatase 49 38 - 126 U/L   Total Bilirubin 0.4 0.3 - 1.2 mg/dL   GFR calc non Af Amer >60 >60 mL/min   GFR calc Af Amer >60 >60 mL/min   Anion gap 12 5 - 15    Comment: Performed at [facility], [place] [address], [place], [place] [address]  I-Stat Beta hCG blood, ED ([facility], [facility], [facility] only)     Status: None   Collection Time: 02/16/19  1:59 AM  Result Value Ref Range   I-stat hCG, quantitative <5.0 <5 mIU/mL   Comment 3            Comment:   GEST. AGE      CONC.  (mIU/mL)   <=1 WEEK        5 - 50     2 WEEKS       50 - 500     3 WEEKS       100 - 10,000     4 WEEKS     1,000 - 30,000        FEMALE AND NON-PREGNANT FEMALE:     LESS THAN 5 mIU/mL   Urinalysis, Routine w reflex microscopic     Status: Abnormal   Collection Time: 02/16/19  4:15 AM  Result Value Ref Range   Color, Urine STRAW (A) YELLOW   APPearance CLEAR CLEAR   Specific Gravity, Urine 1.005 1.005 - 1.030   pH 6.0 5.0 - 8.0   Glucose, UA NEGATIVE NEGATIVE mg/dL   Hgb urine dipstick NEGATIVE NEGATIVE   Bilirubin Urine NEGATIVE NEGATIVE   Ketones, ur NEGATIVE NEGATIVE mg/dL   Protein, ur NEGATIVE NEGATIVE mg/dL   Nitrite NEGATIVE NEGATIVE   Leukocytes,Ua NEGATIVE NEGATIVE    Comment: Performed at [facility], [place] [address] [place], [place] [address]  TSH      Status: None   Collection Time: 02/18/19 12:00 AM  Result Value Ref Range   TSH 1.18 0.41 - 5.90  Urinalysis, Routine w reflex microscopic     Status: Abnormal   Collection Time: 02/19/19 11:47 PM  Result Value Ref Range   Color, Urine YELLOW YELLOW   APPearance CLEAR CLEAR   Specific Gravity, Urine 1.012 1.005 - 1.030   pH 6.0 5.0 - 8.0   Glucose, UA NEGATIVE NEGATIVE mg/dL   Hgb urine dipstick NEGATIVE NEGATIVE   Bilirubin Urine NEGATIVE NEGATIVE   Ketones, ur 20 (A) NEGATIVE mg/dL   Protein, ur NEGATIVE NEGATIVE mg/dL   Nitrite NEGATIVE NEGATIVE   Leukocytes,Ua NEGATIVE NEGATIVE    Comment: Performed at [facility] [address], [place], [place] [id number]  Basic metabolic panel     Status: Abnormal   Collection Time: 02/20/19 12:01 AM  Result Value Ref Range   Sodium 138 135 - 145 mmol/L   Potassium 3.3 (L) 3.5 - 5.1 mmol/L   Chloride 103 98 - 111 mmol/L   CO2 24 22 - 32 mmol/L  Glucose, Bld 101 (H) 70 - 99 mg/dL   BUN 5 (L) 6 - 20 mg/dL   Creatinine, Ser 0.66 0.44 - 1.00 mg/dL   Calcium 9.3 8.9 - 10.3 mg/dL   GFR calc non Af Amer >60 >60 mL/min   GFR calc Af Amer >60 >60 mL/min   Anion gap 11 5 - 15    Comment: Performed at [facility] [address], [place], [place] [address]  CBC     Status: None   Collection Time: 02/20/19 12:01 AM  Result Value Ref Range   WBC 9.7 4.0 - 10.5 K/uL   RBC 4.11 3.87 - 5.11 MIL/uL   Hemoglobin 12.6 12.0 - 15.0 g/dL   HCT 39.2 36.0 - 46.0 %   MCV 95.4 80.0 - 100.0 fL   MCH 30.7 26.0 - 34.0 pg   MCHC 32.1 30.0 - 36.0 g/dL   RDW 12.2 11.5 - 15.5 %   Platelets 245 150 - 400 K/uL   nRBC 0.0 0.0 - 0.2 %    Comment: Performed at [facility], [place] [address] [place], [place] [address]  I-Stat beta hCG blood, ED     Status: None   Collection Time: 02/20/19 12:04 AM  Result Value Ref Range   I-stat hCG, quantitative <5.0 <5 mIU/mL   Comment 3            Comment:   GEST. AGE      CONC.  (mIU/mL)   <=1 WEEK         5 - 50     2 WEEKS       50 - 500     3 WEEKS       100 - 10,000     4 WEEKS     1,000 - 30,000        FEMALE AND NON-PREGNANT FEMALE:     LESS THAN 5 mIU/mL       PHQ2/9: Depression screen [facility] 2/9 01/17/2019 12/12/2018 09/05/2018 02/06/2018 07/04/2017  Decreased Interest 0 0 2 0 0  Down, Depressed, Hopeless 0 0 0 0 0  PHQ - 2 Score 0 0 2 0 0  Altered sleeping 0 0 0 0 -  Tired, decreased energy 0 0 1 0 -  Change in appetite 0 0 0 0 -  Feeling bad or failure about yourself  0 0 0 0 -  Trouble concentrating 0 0 0 0 -  Moving slowly or fidgety/restless 0 0 0 0 -  Suicidal thoughts 0 0 0 0 -  PHQ-9 Score 0 0 3 0 -  Difficult doing work/chores - Not difficult at all Somewhat difficult Not difficult at all -    phq 9 is negative   Fall Risk: Fall Risk  03/05/2019 01/17/2019 12/12/2018 09/05/2018 02/06/2018  Falls in the past year? 0 0 0 Exclusion - non ambulatory No  Number falls in past yr: 0 0 0 0 -  Injury with Fall? 0 0 0 0 -  Follow up - - - Falls prevention discussed -   GAD 7 : Generalized Anxiety Score 03/05/2019 02/06/2018  Nervous, Anxious, on Edge 0 0  Control/stop worrying 1 0  Worry too much - different things 1 0  Trouble relaxing 0 0  Restless 0 0  Easily annoyed or irritable 2 1  Afraid - awful might happen 0 0  Total GAD 7 Score 4 1  Anxiety Difficulty - Not difficult at all  Functional Status Survey: Is the patient deaf or have difficulty hearing?: Yes Does the patient have difficulty seeing, even when wearing glasses/contacts?: No Does the patient have difficulty concentrating, remembering, or making decisions?: Yes Does the patient have difficulty walking or climbing stairs?: No Does the patient have difficulty dressing or bathing?: No Does the patient have difficulty doing errands alone such as visiting a doctor's office or shopping?: No   Assessment & Plan  1. Mass of scalp  Referral will be placed for another dermatologist   2. Anxiety  -  hydrOXYzine (ATARAX/VISTARIL) 10 MG tablet; Take 1 tablet (10 mg total) by mouth 3 (three) times daily as needed.  Dispense: 30 tablet; Refill: 0  3. Cervical cancer screening  - Cytology - PAP  4. Needs flu shot  - Flu Vaccine QUAD 36+ mos IM

## 2019-03-06 ENCOUNTER — Other Ambulatory Visit: Payer: Self-pay | Admitting: Family Medicine

## 2019-03-06 DIAGNOSIS — Z1231 Encounter for screening mammogram for malignant neoplasm of breast: Secondary | ICD-10-CM

## 2019-03-10 LAB — CYTOLOGY - PAP
Chlamydia: NEGATIVE
Diagnosis: NEGATIVE
Molecular Disclaimer: NEGATIVE
Neisseria Gonorrhea: NEGATIVE

## 2019-03-11 ENCOUNTER — Ambulatory Visit: Payer: Medicare Other | Admitting: Family Medicine

## 2019-03-11 ENCOUNTER — Ambulatory Visit (INDEPENDENT_AMBULATORY_CARE_PROVIDER_SITE_OTHER): Payer: Medicare Other | Admitting: Family Medicine

## 2019-03-11 ENCOUNTER — Other Ambulatory Visit: Payer: Self-pay

## 2019-03-11 ENCOUNTER — Encounter: Payer: Self-pay | Admitting: Family Medicine

## 2019-03-11 VITALS — BP 110/70 | HR 70 | Temp 97.7°F | Resp 16 | Ht 60.0 in | Wt 146.5 lb

## 2019-03-11 DIAGNOSIS — H43393 Other vitreous opacities, bilateral: Secondary | ICD-10-CM

## 2019-03-11 DIAGNOSIS — F418 Other specified anxiety disorders: Secondary | ICD-10-CM | POA: Diagnosis not present

## 2019-03-11 MED ORDER — ESCITALOPRAM OXALATE 10 MG PO TABS: 10.0000 mg | ORAL_TABLET | Freq: Every day | ORAL | 0 refills | Status: DC

## 2019-03-11 NOTE — Progress Notes (Signed)
Name: [name]   MRN: [id number]    DOB: February 15, 1981   Date:03/11/2019       Progress Note  Subjective  Chief Complaint  Chief Complaint  Patient presents with  . Weight Loss    Concerned about weight loss due to depression and anxiety. She has loss of appetite.   . Depression    Using InTune Oil for her Depression.  Marland Kitchen Anxiety    Her anxiety has increased. She feel like she needs medication to help her balance home, work and kids. Found two mediations online that she would like to discuss with you. She thinks that these medications will help called Redicalm and Tranqulene. I looked them up they are natural supplements.  . Flashes/floaters    HPI  Eye Floaters: she noticed floaters on both eyes for about one week, permanent , n oblind spots, it floats in front of her eye , described as shiny spots. Advised to see eye doctor  Depression/anxiety: she states symptoms going on for a bout one month, overwhelmed taking care of her children that are being remote learning, she has lost weight, lack of appetite, decrease in libido. Feels anxious. She has a 36 yo, 36 yo and 36 yo boys, she states not having down time. Hard to help them, her mood is up and down. Husband is also on disability but she is the one in charge of the kids. Discussed sharing responsibility     Patient Active Problem List   Diagnosis Date Noted  . S/P cesarean section 01/13/2013  . Bilateral deafness 01/12/2013    Past Surgical History:  Procedure Laterality Date  . CESAREAN SECTION    . CESAREAN SECTION N/A 01/13/2013   Procedure: REPEAT CESAREAN SECTION;  Surgeon: Thornell Sartorius, MD;  Location: [facility];  Service: Obstetrics;  Laterality: N/A;  Jamie from office called and states pt is deaf and needs interpreter on day of surgery.  EDA Royal called and intrepreter scheduled for day of surgery.  . CESAREAN SECTION N/A 06/28/2015   Procedure: CESAREAN SECTION;  Surgeon: Janyth Contes, MD;  Location: [facility];  Service:  Obstetrics;  Laterality: N/A;  . INTRAUTERINE DEVICE (IUD) INSERTION  02/18/2019  . WISDOM TOOTH EXTRACTION      Family History  Problem Relation Age of Onset  . Breast cancer Mother   . Diabetes Father   . Hypertension Brother   . Breast cancer Paternal Grandmother   . Breast cancer Maternal Aunt   . Heart attack Paternal Aunt   . Prostate cancer Paternal Uncle     Social History   Socioeconomic History  . Marital status: Married    Spouse name: Baeleigh Zhong  . Number of children: 3  . Years of education: Not on file  . Highest education level: Associate degree: academic program  Occupational History  . Occupation: disabled     Comment: hearing  Social Needs  . Financial resource strain: Not very hard  . Food insecurity    Worry: Sometimes true    Inability: Sometimes true  . Transportation needs    Medical: No    Non-medical: No  Tobacco Use  . Smoking status: Never Smoker  . Smokeless tobacco: Never Used  Substance and Sexual Activity  . Alcohol use: Not Currently    Alcohol/week: 1.0 standard drinks    Types: 1 Glasses of wine per week  . Drug use: Never  . Sexual activity: Yes    Partners: Male    Birth control/protection:  I.U.D.  Lifestyle  . Physical activity    Days per week: 0 days    Minutes per session: 0 min  . Stress: To some extent  Relationships  . Social connections    Talks on phone: More than three times a week    Gets together: Three times a week    Attends religious service: Never    Active member of club or organization: No    Attends meetings of clubs or organizations: Never    Relationship status: Married  . Intimate partner violence    Fear of current or ex partner: No    Emotionally abused: No    Physically abused: No    Forced sexual activity: No  Other Topics Concern  . Not on file  Social History Narrative   Married with 3 children, stay at home with her children     Current Outpatient Medications:  .  levonorgestrel  (MIRENA) 20 MCG/24HR IUD, 1 each by Intrauterine route once., Disp: , Rfl:  .  hydrOXYzine (ATARAX/VISTARIL) 10 MG tablet, Take 1 tablet (10 mg total) by mouth 3 (three) times daily as needed. (Patient not taking: Reported on 03/11/2019), Disp: 30 tablet, Rfl: 0  Allergies  Allergen Reactions  . Nickel Rash    I personally reviewed active problem list, medication list, allergies, family history, social history with the patient/caregiver today.   ROS  Constitutional: Negative for fever, positive for weight change.  Respiratory: Negative for cough and shortness of breath.   Cardiovascular: Negative for chest pain or palpitations.  Gastrointestinal: Negative for abdominal pain, no bowel changes.  Musculoskeletal: Negative for gait problem or joint swelling.  Skin: Negative for rash.  Neurological: Negative for dizziness or headache.  No other specific complaints in a complete review of systems (except as listed in HPI above).  Objective  Vitals:   03/11/19 1415  BP: 110/70  Pulse: 70  Resp: 16  Temp: 97.7 F (36.5 C)  TempSrc: Temporal  SpO2: 98%  Weight: 146 lb 8 oz (66.5 kg)  Height: 5' (1.524 m)    Body mass index is 28.61 kg/m.  Physical Exam  Constitutional: Patient appears well-developed and well-nourished. Obese No distress.  HEENT: head atraumatic, normocephalic, pupils equal and reactive to light,  neck supple, throat within normal limits Cardiovascular: Normal rate, regular rhythm and normal heart sounds.  No murmur heard. No BLE edema. Pulmonary/Chest: Effort normal and breath sounds normal. No respiratory distress. Abdominal: Soft.  There is no tenderness. Psychiatric: Patient has a normal mood and affect. behavior is normal. Judgment and thought content normal.   Recent Results (from the past 2160 hour(s))  CBC with Differential     Status: None   Collection Time: 02/16/19  1:50 AM  Result Value Ref Range   WBC 9.0 4.0 - 10.5 K/uL   RBC 3.90 3.87 - 5.11  MIL/uL   Hemoglobin 12.2 12.0 - 15.0 g/dL   HCT 37.5 36.0 - 46.0 %   MCV 96.2 80.0 - 100.0 fL   MCH 31.3 26.0 - 34.0 pg   MCHC 32.5 30.0 - 36.0 g/dL   RDW 12.4 11.5 - 15.5 %   Platelets 233 150 - 400 K/uL   nRBC 0.0 0.0 - 0.2 %   Neutrophils Relative % 79 %   Neutro Abs 7.1 1.7 - 7.7 K/uL   Lymphocytes Relative 13 %   Lymphs Abs 1.2 0.7 - 4.0 K/uL   Monocytes Relative 7 %   Monocytes Absolute 0.6 0.1 -  1.0 K/uL   Eosinophils Relative 1 %   Eosinophils Absolute 0.1 0.0 - 0.5 K/uL   Basophils Relative 0 %   Basophils Absolute 0.0 0.0 - 0.1 K/uL   Immature Granulocytes 0 %   Abs Immature Granulocytes 0.03 0.00 - 0.07 K/uL    Comment: Performed at [facility], [place] [address] [place], [place] [address]  Comprehensive metabolic panel     Status: Abnormal   Collection Time: 02/16/19  1:50 AM  Result Value Ref Range   Sodium 135 135 - 145 mmol/L   Potassium 3.3 (L) 3.5 - 5.1 mmol/L   Chloride 101 98 - 111 mmol/L   CO2 22 22 - 32 mmol/L   Glucose, Bld 128 (H) 70 - 99 mg/dL   BUN 10 6 - 20 mg/dL   Creatinine, Ser 0.75 0.44 - 1.00 mg/dL   Calcium 8.9 8.9 - 10.3 mg/dL   Total Protein 6.6 6.5 - 8.1 g/dL   Albumin 3.9 3.5 - 5.0 g/dL   AST 15 15 - 41 U/L   ALT 16 0 - 44 U/L   Alkaline Phosphatase 49 38 - 126 U/L   Total Bilirubin 0.4 0.3 - 1.2 mg/dL   GFR calc non Af Amer >60 >60 mL/min   GFR calc Af Amer >60 >60 mL/min   Anion gap 12 5 - 15    Comment: Performed at [facility], [place] [address], [place], [place] [address]  I-Stat Beta hCG blood, ED ([facility], [facility], [facility] only)     Status: None   Collection Time: 02/16/19  1:59 AM  Result Value Ref Range   I-stat hCG, quantitative <5.0 <5 mIU/mL   Comment 3            Comment:   GEST. AGE      CONC.  (mIU/mL)   <=1 WEEK        5 - 50     2 WEEKS       50 - 500     3 WEEKS       100 - 10,000     4 WEEKS     1,000 - 30,000        FEMALE AND NON-PREGNANT FEMALE:     LESS THAN 5 mIU/mL   Urinalysis, Routine w reflex microscopic      Status: Abnormal   Collection Time: 02/16/19  4:15 AM  Result Value Ref Range   Color, Urine STRAW (A) YELLOW   APPearance CLEAR CLEAR   Specific Gravity, Urine 1.005 1.005 - 1.030   pH 6.0 5.0 - 8.0   Glucose, UA NEGATIVE NEGATIVE mg/dL   Hgb urine dipstick NEGATIVE NEGATIVE   Bilirubin Urine NEGATIVE NEGATIVE   Ketones, ur NEGATIVE NEGATIVE mg/dL   Protein, ur NEGATIVE NEGATIVE mg/dL   Nitrite NEGATIVE NEGATIVE   Leukocytes,Ua NEGATIVE NEGATIVE    Comment: Performed at [facility], [place] [address], [place], [place] [address]  TSH     Status: None   Collection Time: 02/18/19 12:00 AM  Result Value Ref Range   TSH 1.18 0.41 - 5.90  Urinalysis, Routine w reflex microscopic     Status: Abnormal   Collection Time: 02/19/19 11:47 PM  Result Value Ref Range   Color, Urine YELLOW YELLOW   APPearance CLEAR CLEAR   Specific Gravity, Urine 1.012 1.005 - 1.030   pH 6.0 5.0 - 8.0   Glucose, UA NEGATIVE NEGATIVE mg/dL   Hgb urine dipstick NEGATIVE NEGATIVE  Bilirubin Urine NEGATIVE NEGATIVE   Ketones, ur 20 (A) NEGATIVE mg/dL   Protein, ur NEGATIVE NEGATIVE mg/dL   Nitrite NEGATIVE NEGATIVE   Leukocytes,Ua NEGATIVE NEGATIVE    Comment: Performed at [facility] [address] [place], [place] [id number]  Basic metabolic panel     Status: Abnormal   Collection Time: 02/20/19 12:01 AM  Result Value Ref Range   Sodium 138 135 - 145 mmol/L   Potassium 3.3 (L) 3.5 - 5.1 mmol/L   Chloride 103 98 - 111 mmol/L   CO2 24 22 - 32 mmol/L   Glucose, Bld 101 (H) 70 - 99 mg/dL   BUN 5 (L) 6 - 20 mg/dL   Creatinine, Ser 0.66 0.44 - 1.00 mg/dL   Calcium 9.3 8.9 - 10.3 mg/dL   GFR calc non Af Amer >60 >60 mL/min   GFR calc Af Amer >60 >60 mL/min   Anion gap 11 5 - 15    Comment: Performed at [facility], [place] [address], [place], [place] [address]  CBC     Status: None   Collection Time: 02/20/19 12:01 AM  Result Value Ref Range   WBC 9.7 4.0 - 10.5 K/uL   RBC 4.11 3.87  - 5.11 MIL/uL   Hemoglobin 12.6 12.0 - 15.0 g/dL   HCT 39.2 36.0 - 46.0 %   MCV 95.4 80.0 - 100.0 fL   MCH 30.7 26.0 - 34.0 pg   MCHC 32.1 30.0 - 36.0 g/dL   RDW 12.2 11.5 - 15.5 %   Platelets 245 150 - 400 K/uL   nRBC 0.0 0.0 - 0.2 %    Comment: Performed at [facility], [place] [address], [place], [place] [address]  I-Stat beta hCG blood, ED     Status: None   Collection Time: 02/20/19 12:04 AM  Result Value Ref Range   I-stat hCG, quantitative <5.0 <5 mIU/mL   Comment 3            Comment:   GEST. AGE      CONC.  (mIU/mL)   <=1 WEEK        5 - 50     2 WEEKS       50 - 500     3 WEEKS       100 - 10,000     4 WEEKS     1,000 - 30,000        FEMALE AND NON-PREGNANT FEMALE:     LESS THAN 5 mIU/mL   Cytology - PAP     Status: None   Collection Time: 03/05/19  3:18 PM  Result Value Ref Range   Chlamydia Negative    Neisseria Gonorrhea Negative    Adequacy      Satisfactory for evaluation; transformation zone component PRESENT.   Diagnosis      - Negative for intraepithelial lesion or malignancy (NILM)   Molecular Disclaimer      APTIMA Combo 2 assay on the Panther  system is a target amplification   Molecular Disclaimer      nucleic acid probe test that utilizes target capture for in vitro   Molecular Disclaimer      qualitative detection and differentiation of ribosomal RNA from   Molecular Disclaimer      Chlamydia trachomatis (CT) and/or Neisseria gonorrhoeae (NG).  This test   Molecular Disclaimer      was validated and its appropriate performance characteristics determined   [employer] fast streamer  by the reporting laboratory. This laboratory is certified under the   [employer] as qualified to perform high complexity clinical Insurance account manager testing. Normal Reference Range-Negative       PHQ2/9: Depression screen [facility] 2/9 03/11/2019 01/17/2019 12/12/2018 09/05/2018 02/06/2018  Decreased Interest 3 0 0 2 0  Down,  Depressed, Hopeless 3 0 0 0 0  PHQ - 2 Score 6 0 0 2 0  Altered sleeping 2 0 0 0 0  Tired, decreased energy 3 0 0 1 0  Change in appetite 3 0 0 0 0  Feeling bad or failure about yourself  2 0 0 0 0  Trouble concentrating 1 0 0 0 0  Moving slowly or fidgety/restless 0 0 0 0 0  Suicidal thoughts 0 0 0 0 0  PHQ-9 Score 17 0 0 3 0  Difficult doing work/chores Very difficult - Not difficult at all Somewhat difficult Not difficult at all    phq 9 is positive   GAD 7 : Generalized Anxiety Score 03/05/2019 02/06/2018  Nervous, Anxious, on Edge 0 0  Control/stop worrying 1 0  Worry too much - different things 1 0  Trouble relaxing 0 0  Restless 0 0  Easily annoyed or irritable 2 1  Afraid - awful might happen 0 0  Total GAD 7 Score 4 1  Anxiety Difficulty - Not difficult at all    Fall Risk: Fall Risk  03/11/2019 03/05/2019 01/17/2019 12/12/2018 09/05/2018  Falls in the past year? 0 0 0 0 Exclusion - non ambulatory  Number falls in past yr: 0 0 0 0 0  Injury with Fall? 0 0 0 0 0  Follow up - - - - Falls prevention discussed     Functional Status Survey: Is the patient deaf or have difficulty hearing?: Yes Does the patient have difficulty seeing, even when wearing glasses/contacts?: No Does the patient have difficulty concentrating, remembering, or making decisions?: Yes Does the patient have difficulty walking or climbing stairs?: No Does the patient have difficulty dressing or bathing?: No Does the patient have difficulty doing errands alone such as visiting a doctor's office or shopping?: No    Assessment & Plan   1. Floaters, bilateral  She will contact eye doctor   2. Depression with anxiety  - escitalopram (LEXAPRO) 10 MG tablet; Take 1 tablet (10 mg total) by mouth daily.  Dispense: 30 tablet; Refill: 0 - Ambulatory referral to Psychology

## 2019-03-12 ENCOUNTER — Emergency Department (HOSPITAL_COMMUNITY)
Admission: EM | Admit: 2019-03-12 | Discharge: 2019-03-12 | Disposition: A | Discharge status: Home / Self Care | Payer: Medicare Other | Attending: Emergency Medicine | Admitting: Emergency Medicine

## 2019-03-12 ENCOUNTER — Ambulatory Visit: Payer: Self-pay | Admitting: *Deleted

## 2019-03-12 ENCOUNTER — Encounter (HOSPITAL_COMMUNITY): Payer: Self-pay | Admitting: Emergency Medicine

## 2019-03-12 DIAGNOSIS — T43205A Adverse effect of unspecified antidepressants, initial encounter: Secondary | ICD-10-CM | POA: Diagnosis not present

## 2019-03-12 DIAGNOSIS — T887XXA Unspecified adverse effect of drug or medicament, initial encounter: Secondary | ICD-10-CM | POA: Diagnosis not present

## 2019-03-12 DIAGNOSIS — T50905A Adverse effect of unspecified drugs, medicaments and biological substances, initial encounter: Secondary | ICD-10-CM | POA: Diagnosis not present

## 2019-03-12 DIAGNOSIS — Y69 Unspecified misadventure during surgical and medical care: Secondary | ICD-10-CM | POA: Insufficient documentation

## 2019-03-12 DIAGNOSIS — R531 Weakness: Secondary | ICD-10-CM | POA: Diagnosis not present

## 2019-03-12 DIAGNOSIS — R419 Unspecified symptoms and signs involving cognitive functions and awareness: Secondary | ICD-10-CM | POA: Insufficient documentation

## 2019-03-12 DIAGNOSIS — H538 Other visual disturbances: Secondary | ICD-10-CM | POA: Diagnosis not present

## 2019-03-12 DIAGNOSIS — R110 Nausea: Secondary | ICD-10-CM | POA: Diagnosis not present

## 2019-03-12 DIAGNOSIS — R5381 Other malaise: Secondary | ICD-10-CM | POA: Diagnosis not present

## 2019-03-12 LAB — BASIC METABOLIC PANEL
Anion gap: 12 (ref 5–15)
BUN: 5 mg/dL — ABNORMAL LOW (ref 6–20)
CO2: 25 mmol/L (ref 22–32)
Calcium: 9.3 mg/dL (ref 8.9–10.3)
Chloride: 102 mmol/L (ref 98–111)
Creatinine, Ser: 0.76 mg/dL (ref 0.44–1.00)
GFR calc Af Amer: >60 mL/min (ref >60)
GFR calc non Af Amer: >60 mL/min (ref >60)
Glucose, Bld: 113 mg/dL — ABNORMAL HIGH (ref 70–99)
Potassium: 3.4 mmol/L — ABNORMAL LOW (ref 3.5–5.1)
Sodium: 139 mmol/L (ref 135–145)

## 2019-03-12 LAB — CBC
HCT: 40.9 % (ref 36.0–46.0)
Hemoglobin: 13.3 g/dL (ref 12.0–15.0)
MCH: 30.9 pg (ref 26.0–34.0)
MCHC: 32.5 g/dL (ref 30.0–36.0)
MCV: 95.1 fL (ref 80.0–100.0)
Platelets: 246 10*3/uL (ref 150–400)
RBC: 4.3 MIL/uL (ref 3.87–5.11)
RDW: 12.5 % (ref 11.5–15.5)
WBC: 6.5 10*3/uL (ref 4.0–10.5)
nRBC: 0 % (ref 0.0–0.2)

## 2019-03-12 LAB — I-STAT BETA HCG BLOOD, ED (MC, WL, AP ONLY): I-stat hCG, quantitative: <5 m[IU]/mL (ref <5)

## 2019-03-12 NOTE — ED Notes (Signed)
ED Provider at bedside. 

## 2019-03-12 NOTE — ED Triage Notes (Signed)
Pt reports started lexapro 3-4 days ago and is having new symptoms. She is having floaters in both eyes with dots and flashes. No history of migraines. Also having nausea, and had at BP of 140 this morning. Sign language interpreter used.

## 2019-03-12 NOTE — ED Provider Notes (Signed)
[facility] EMERGENCY DEPARTMENT Provider Note  Sign language interpreter was used during the visit CSN: [id number] Arrival date & time: 03/12/19  [id number]     History   Chief Complaint Nausea, mental fogginess  HPI [name] is a 38 y.o. female.     HPI Patient states she has been having issues with depression and anxiety for about a month.  She has had decreased weight, loss of appetite decreased libido.  She has been feeling anxious and overwhelmed taking care of her kids.  Patient saw her primary care doctor yesterday.  She was given a prescription for Lexapro and took her first dose last night.  Patient states when she woke up this morning she was trying to help her kids get ready and she felt mentally foggy.  She is having difficulty completing her tasks.  She was feeling nauseated.  She started to feel somewhat anxious and called EMS.  Her blood pressure was checked and it was elevated in the 140s.  Patient also has been having some difficulty with floaters going across her field of vision for the past week or so.  Patient was seen by her primary doctor and is supposed to schedule an appointment with an ophthalmologist.  No double vision.  No focal numbness or weakness. Past Medical History:  Diagnosis Date  . Deafness   . Gastroesophageal reflux in pregancy   . GERD (gastroesophageal reflux disease)   . IUD (intrauterine device) in place 08/11/2015  . Normal pregnancy, repeat 01/12/2013  . Normal pregnancy, repeat 06/27/2015  . PONV (postoperative nausea and vomiting)   . S/P cesarean section 01/13/2013    Patient Active Problem List   Diagnosis Date Noted  . S/P cesarean section 01/13/2013  . Bilateral deafness 01/12/2013    Past Surgical History:  Procedure Laterality Date  . CESAREAN SECTION    . CESAREAN SECTION N/A 01/13/2013   Procedure: REPEAT CESAREAN SECTION;  Surgeon: Thornell Sartorius, MD;  Location: [facility];  Service: Obstetrics;  Laterality: N/A;   Jamie from office called and states pt is deaf and needs interpreter on day of surgery.  EDA Royal called and intrepreter scheduled for day of surgery.  . CESAREAN SECTION N/A 06/28/2015   Procedure: CESAREAN SECTION;  Surgeon: Janyth Contes, MD;  Location: [facility];  Service: Obstetrics;  Laterality: N/A;  . INTRAUTERINE DEVICE (IUD) INSERTION  02/18/2019  . WISDOM TOOTH EXTRACTION       OB History    Gravida  3   Para  3   Term  3   Preterm      AB      Living  2     SAB      TAB      Ectopic      Multiple  0   Live Births  2            Home Medications    Prior to Admission medications   Medication Sig Start Date End Date Taking? Authorizing Provider  escitalopram (LEXAPRO) 10 MG tablet Take 1 tablet (10 mg total) by mouth daily. Patient taking differently: Take 10 mg by mouth at bedtime.  03/11/19  Yes Steele Sizer, MD  levonorgestrel (MIRENA) 20 MCG/24HR IUD 1 each by Intrauterine route once.   Yes [provider]  hydrOXYzine (ATARAX/VISTARIL) 10 MG tablet Take 1 tablet (10 mg total) by mouth 3 (three) times daily as needed. Patient not taking: Reported on 03/11/2019 03/05/19   Steele Sizer,  MD    Family History Family History  Problem Relation Age of Onset  . Breast cancer Mother   . Diabetes Father   . Hypertension Brother   . Breast cancer Paternal Grandmother   . Breast cancer Maternal Aunt   . Heart attack Paternal Aunt   . Prostate cancer Paternal Uncle     Social History Social History   Tobacco Use  . Smoking status: Never Smoker  . Smokeless tobacco: Never Used  Substance Use Topics  . Alcohol use: Not Currently    Alcohol/week: 1.0 standard drinks    Types: 1 Glasses of wine per week  . Drug use: Never     Allergies   Nickel   Review of Systems Review of Systems  All other systems reviewed and are negative.    Physical Exam Updated Vital Signs BP 111/78 (BP Location: Right Arm)   Pulse 71   Temp  98.5 F (36.9 C) (Oral)   Resp 18   SpO2 98%   Physical Exam Vitals signs and nursing note reviewed.  Constitutional:      General: She is not in acute distress.    Appearance: She is well-developed.  HENT:     Head: Normocephalic and atraumatic.     Right Ear: External ear normal.     Left Ear: External ear normal.  Eyes:     General: No scleral icterus.       Right eye: No discharge.        Left eye: No discharge.     Conjunctiva/sclera: Conjunctivae normal.     Comments: No visual defects  Neck:     Musculoskeletal: Neck supple.     Trachea: No tracheal deviation.  Cardiovascular:     Rate and Rhythm: Normal rate and regular rhythm.  Pulmonary:     Effort: Pulmonary effort is normal. No respiratory distress.     Breath sounds: Normal breath sounds. No stridor. No wheezing or rales.  Abdominal:     General: Bowel sounds are normal. There is no distension.     Palpations: Abdomen is soft.     Tenderness: There is no abdominal tenderness. There is no guarding or rebound.  Musculoskeletal:        General: No tenderness.  Skin:    General: Skin is warm and dry.     Findings: No rash.  Neurological:     Mental Status: She is alert.     Cranial Nerves: No cranial nerve deficit (no facial droop, extraocular movements intact, no slurred speech).     Sensory: No sensory deficit.     Motor: No abnormal muscle tone or seizure activity.     Coordination: Coordination normal.      ED Treatments / Results  Labs (all labs ordered are listed, but only abnormal results are displayed) Labs Reviewed  BASIC METABOLIC PANEL - Abnormal; Notable for the following components:      Result Value   Potassium 3.4 (*)    Glucose, Bld 113 (*)    BUN 5 (*)    All other components within normal limits  CBC  I-STAT BETA HCG BLOOD, ED ([facility], [facility], [facility] ONLY)    EKG None  Radiology No results found.  Procedures Procedures (including critical care time)  Medications Ordered in ED  Medications - No data to display   Initial Impression / Assessment and Plan / ED Course  I have reviewed the triage vital signs and the nursing notes.  Pertinent labs &  imaging results that were available during my care of the patient were reviewed by me and considered in my medical decision making (see chart for details).  Clinical Course as of Mar 11 1058  Wed Mar 12, 2019  1058 Labs reviewed.  No significant abnormalities.   [JK]    Clinical Course User Index [JK] Dorie Rank, MD     Repeat blood pressure is normal.  Patient is not have any focal neurologic deficits.  I suspect her symptoms may be related to either her anxiety or possibly some side effects from her Lexapro medication.  Could continue medication for a while longer to see if the sx decrease or could discontinue if she prefer.  Discussed outpt follow up with PCP.     Final Clinical Impressions(s) / ED Diagnoses   Final diagnoses:  Adverse effect of drug, initial encounter    ED Discharge Orders    None       Dorie Rank, MD 03/12/19 1100

## 2019-03-12 NOTE — Telephone Encounter (Signed)
Patient notified per Dr. Ancil Boozer recommendation to only take half of the Lexapro firs thing in the morning. However, patient drug interaction makes her worried on taking it ever again. Also patient read on the Hydroxyzine side effects could cause potential death. She has declined on taking either one. Informed patient [facility] has tried to call her yesterday, however, she was not available. Gave her the referral information for Dr. Bobby Rumpf and to reach out to her about getting seen soon as possible in their office.

## 2019-03-12 NOTE — Telephone Encounter (Signed)
Pt called using sign language interpreter. Pt stated she feels like she is having side effects from taking Lexapro. She was prescribed this yesterday and took the first dose last night. She felt dizzy this morning, weakness. She was not able to concentrate to help her son with his virtual schooling. She felt so bad that she called 911 and was taken to the hospital. She was discharged and advised to speak with her provider regarding her medication. She only took one dose of the medication and states she feels worst now than this morning. Per protocol, pt needs to speak with the provider. The practice was notified. Call transferred to the practice.   Reason for Disposition . [1] Request for URGENT new prescription or refill of "essential" medication (i.e., likelihood of harm to patient if not taken) AND [2] triager unable to fill per unit policy  Answer Assessment - Initial Assessment Questions 1.   NAME of MEDICATION: "What medicine are you calling about?"     lexapro 2.   QUESTION: "What is your question?"     Thinks she is having side effects from this medication 3.   PRESCRIBING HCP: "Who prescribed it?" Reason: if prescribed by specialist, call should be referred to that group.     Dr.Sowles 4. SYMPTOMS: "Do you have any symptoms?"     Dizziness, weakness, not concentrating, not able to focus 5. SEVERITY: If symptoms are present, ask "Are they mild, moderate or severe?"     Moderate to severe 6.  PREGNANCY:  "Is there any chance that you are pregnant?" "When was your last menstrual period?"     n  Protocols used: MEDICATION QUESTION CALL-A-AH

## 2019-03-12 NOTE — ED Notes (Signed)
Called for sign language interpreter

## 2019-03-12 NOTE — Discharge Instructions (Signed)
Follow-up with your primary care doctor to discuss your medications/possible switch to alternative medication

## 2019-03-12 NOTE — ED Notes (Signed)
Pt brought to waiting room after mother called and states she is OTW to pick her up. Mother currently in [place]. [employer] and nurse first informed pt is waiting for ride and is deaf.

## 2019-03-13 ENCOUNTER — Ambulatory Visit: Payer: Self-pay

## 2019-03-13 NOTE — Telephone Encounter (Signed)
Mom speaking for pt. Who is deaf. Reports she had "a pretty bad nose bleed this morning - I've never seen so much blood from a nose bleed." Bleeding has stopped. Seen in ED yesterday for another issue.Reports she is not on a blood thinner. Warm transfer to [place] in the practice.  Answer Assessment - Initial Assessment Questions 1. AMOUNT OF BLEEDING: "How bad is the bleeding?" "How much blood was lost?" "Has the bleeding stopped?"   - MILD: needed a couple tissues   - MODERATE: needed many tissues   - SEVERE: large blood clots, soaked many tissues, lasted more than 30 minutes      Many tissues 2. ONSET: "When did the nosebleed start?"      This morning 3. FREQUENCY: "How many nosebleeds have you had in the last 24 hours?"       X 1 4. RECURRENT SYMPTOMS: "Have there been other recent nosebleeds?" If so, ask: "How long did it take you to stop the bleeding?" "What worked best?"      No 5. CAUSE: "What do you think caused this nosebleed?"     Unsure 6. LOCAL FACTORS: "Do you have any cold symptoms?", "Have you been rubbing or picking at your nose?"     No 7. SYSTEMIC FACTORS: "Do you have high blood pressure or any bleeding problems?"     No 8. BLOOD THINNERS: "Do you take any blood thinners?" (e.g., coumadin, heparin, aspirin, Plavix)     No 9. OTHER SYMPTOMS: "Do you have any other symptoms?" (e.g., lightheadedness)     Dizzy yesterday and went ED 10. PREGNANCY: "Is there any chance you are pregnant?" "When was your last menstrual period?"       No  Protocols used: NOSEBLEED-A-AH

## 2019-03-14 ENCOUNTER — Ambulatory Visit
Admission: RE | Admit: 2019-03-14 | Discharge: 2019-03-14 | Disposition: A | Discharge status: Home / Self Care | Payer: Medicare Other | Source: Ambulatory Visit | Attending: Family Medicine | Admitting: Family Medicine

## 2019-03-14 DIAGNOSIS — Z1231 Encounter for screening mammogram for malignant neoplasm of breast: Secondary | ICD-10-CM | POA: Insufficient documentation

## 2019-03-17 ENCOUNTER — Other Ambulatory Visit: Payer: Self-pay | Admitting: Family Medicine

## 2019-03-17 DIAGNOSIS — R928 Other abnormal and inconclusive findings on diagnostic imaging of breast: Secondary | ICD-10-CM

## 2019-03-17 DIAGNOSIS — R921 Mammographic calcification found on diagnostic imaging of breast: Secondary | ICD-10-CM

## 2019-03-17 DIAGNOSIS — N6489 Other specified disorders of breast: Secondary | ICD-10-CM

## 2019-03-21 ENCOUNTER — Ambulatory Visit
Admission: RE | Admit: 2019-03-21 | Discharge: 2019-03-21 | Disposition: A | Discharge status: Home / Self Care | Payer: Medicare Other | Source: Ambulatory Visit | Attending: Family Medicine | Admitting: Family Medicine

## 2019-03-21 DIAGNOSIS — R928 Other abnormal and inconclusive findings on diagnostic imaging of breast: Secondary | ICD-10-CM

## 2019-03-21 DIAGNOSIS — N6489 Other specified disorders of breast: Secondary | ICD-10-CM | POA: Insufficient documentation

## 2019-03-21 DIAGNOSIS — R921 Mammographic calcification found on diagnostic imaging of breast: Secondary | ICD-10-CM

## 2019-03-31 ENCOUNTER — Telehealth: Payer: Self-pay | Admitting: Family Medicine

## 2019-03-31 NOTE — Telephone Encounter (Signed)
Patient is requesting a referral to an ENT due to a lump behind her left ear.  Call back # [phone]

## 2019-04-01 NOTE — Telephone Encounter (Signed)
LVM informing patient that the referral to [facility]NT has been resubmitted.  [facility] will call Ms. Britten to schedule an appointment and once the appointment has been scheduled they will let our office know.  I also left the phone # to [facility] [phone] for patient.

## 2019-04-10 ENCOUNTER — Other Ambulatory Visit: Payer: Self-pay | Admitting: Otolaryngology

## 2019-04-10 DIAGNOSIS — R221 Localized swelling, mass and lump, neck: Secondary | ICD-10-CM

## 2019-04-11 ENCOUNTER — Other Ambulatory Visit: Payer: Self-pay

## 2019-04-11 ENCOUNTER — Ambulatory Visit (INDEPENDENT_AMBULATORY_CARE_PROVIDER_SITE_OTHER): Payer: Medicare Other | Admitting: Family Medicine

## 2019-04-11 ENCOUNTER — Encounter: Payer: Self-pay | Admitting: Family Medicine

## 2019-04-11 VITALS — BP 110/70 | HR 106 | Temp 97.3°F | Resp 16 | Ht 60.0 in | Wt 154.2 lb

## 2019-04-11 DIAGNOSIS — F418 Other specified anxiety disorders: Secondary | ICD-10-CM

## 2019-04-11 DIAGNOSIS — R590 Localized enlarged lymph nodes: Secondary | ICD-10-CM | POA: Diagnosis not present

## 2019-04-11 NOTE — Progress Notes (Signed)
Name: [name]   MRN: [id number]    DOB: 03-14-1981   Date:04/11/2019       Progress Note  Subjective  Chief Complaint  Chief Complaint  Patient presents with  . Ear Problem    continues to have pain in her ear. She is scheduled to see Otolaryngoloist on October 28.  Marland Kitchen Anxiety  . Depression    HPI  Lymphadenopathy: she was seen by ENT yesterday and was given options. US neck, watchful waiting or biopsy. She chose [place] and will have that done next week. She states pain has resolved, but still feels the lump on posterior auricular area  Depression/anxiety: she started to feel overwhelmed taking care of her children that are being remote learning around September 2020  she had lost weight, lack of appetite, decrease in libido. She was feeling  anxious. Husband is also on disability but she is the one in charge of the kids. We tried Lexapro but she had nose bleed and stopped medication, she states things are getting better, husband is helping her out more and phq 9 is back to normal, she feels fine without medications at this time  Patient Active Problem List   Diagnosis Date Noted  . S/P cesarean section 01/13/2013  . Bilateral deafness 01/12/2013    Past Surgical History:  Procedure Laterality Date  . CESAREAN SECTION    . CESAREAN SECTION N/A 01/13/2013   Procedure: REPEAT CESAREAN SECTION;  Surgeon: Thornell Sartorius, MD;  Location: [facility];  Service: Obstetrics;  Laterality: N/A;  Jamie from office called and states pt is deaf and needs interpreter on day of surgery.  [facility] called and intrepreter scheduled for day of surgery.  . CESAREAN SECTION N/A 06/28/2015   Procedure: CESAREAN SECTION;  Surgeon: Janyth Contes, MD;  Location: [facility];  Service: Obstetrics;  Laterality: N/A;  . INTRAUTERINE DEVICE (IUD) INSERTION  02/18/2019  . WISDOM TOOTH EXTRACTION      Family History  Problem Relation Age of Onset  . Breast cancer Mother        pt doesnt remember  . Diabetes  Father   . Hypertension Brother   . Breast cancer Paternal Grandmother   . Breast cancer Maternal Aunt   . Heart attack Paternal Aunt   . Prostate cancer Paternal Uncle     Social History   Socioeconomic History  . Marital status: Married    Spouse name: Bannie Khan  . Number of children: 3  . Years of education: Not on file  . Highest education level: Associate degree: academic program  Occupational History  . Occupation: disabled     Comment: hearing  Social Needs  . Financial resource strain: Not very hard  . Food insecurity    Worry: Sometimes true    Inability: Sometimes true  . Transportation needs    Medical: No    Non-medical: No  Tobacco Use  . Smoking status: Never Smoker  . Smokeless tobacco: Never Used  Substance and Sexual Activity  . Alcohol use: Not Currently    Alcohol/week: 1.0 standard drinks    Types: 1 Glasses of wine per week  . Drug use: Never  . Sexual activity: Yes    Partners: Male    Birth control/protection: I.U.D.  Lifestyle  . Physical activity    Days per week: 0 days    Minutes per session: 0 min  . Stress: To some extent  Relationships  . Social Herbalist on phone:  More than three times a week    Gets together: Three times a week    Attends religious service: Never    Active member of club or organization: No    Attends meetings of clubs or organizations: Never    Relationship status: Married  . Intimate partner violence    Fear of current or ex partner: No    Emotionally abused: No    Physically abused: No    Forced sexual activity: No  Other Topics Concern  . Not on file  Social History Narrative   Married with 3 children, stay at home with her children     Current Outpatient Medications:  .  levonorgestrel (MIRENA) 20 MCG/24HR IUD, 1 each by Intrauterine route once., Disp: , Rfl:  .  escitalopram (LEXAPRO) 10 MG tablet, Take 1 tablet (10 mg total) by mouth daily. (Patient not taking: Reported on  04/11/2019), Disp: 30 tablet, Rfl: 0 .  hydrOXYzine (ATARAX/VISTARIL) 10 MG tablet, Take 1 tablet (10 mg total) by mouth 3 (three) times daily as needed. (Patient not taking: Reported on 04/11/2019), Disp: 30 tablet, Rfl: 0  Allergies  Allergen Reactions  . Lexapro [Escitalopram Oxalate] Nausea Only and Hypertension  . Nickel Rash    I personally reviewed active problem list, medication list, allergies, family history, social history with the patient/caregiver today.   ROS  Constitutional: Negative for fever or weight change.  Respiratory: Negative for cough and shortness of breath.   Cardiovascular: Negative for chest pain or palpitations.  Gastrointestinal: Negative for abdominal pain, no bowel changes.  Musculoskeletal: Negative for gait problem or joint swelling.  Skin: Negative for rash.  Neurological: Negative for dizziness or headache.  No other specific complaints in a complete review of systems (except as listed in HPI above).  Objective  Vitals:   04/11/19 1013  BP: 110/70  Pulse: (!) 106  Resp: 16  Temp: (!) 97.3 F (36.3 C)  TempSrc: Temporal  SpO2: 99%  Weight: 154 lb 3.2 oz (69.9 kg)  Height: 5' (1.524 m)    Body mass index is 30.12 kg/m.  Physical Exam  Constitutional: Patient appears well-developed and well-nourished. Obese No distress.  HEENT: head atraumatic, normocephalic, pupils equal and reactive to light, ears normal TM,  neck supple, throat within normal limits, non tender left posterior auricular lymphadenopathy  Cardiovascular: Normal rate, regular rhythm and normal heart sounds.  No murmur heard. No BLE edema. Pulmonary/Chest: Effort normal and breath sounds normal. No respiratory distress. Abdominal: Soft.  There is no tenderness. Psychiatric: Patient has a normal mood and affect. behavior is normal. Judgment and thought content normal.  Recent Results (from the past 2160 hour(s))  CBC with Differential     Status: None   Collection Time:  02/16/19  1:50 AM  Result Value Ref Range   WBC 9.0 4.0 - 10.5 K/uL   RBC 3.90 3.87 - 5.11 MIL/uL   Hemoglobin 12.2 12.0 - 15.0 g/dL   HCT 37.5 36.0 - 46.0 %   MCV 96.2 80.0 - 100.0 fL   MCH 31.3 26.0 - 34.0 pg   MCHC 32.5 30.0 - 36.0 g/dL   RDW 12.4 11.5 - 15.5 %   Platelets 233 150 - 400 K/uL   nRBC 0.0 0.0 - 0.2 %   Neutrophils Relative % 79 %   Neutro Abs 7.1 1.7 - 7.7 K/uL   Lymphocytes Relative 13 %   Lymphs Abs 1.2 0.7 - 4.0 K/uL   Monocytes Relative 7 %  Monocytes Absolute 0.6 0.1 - 1.0 K/uL   Eosinophils Relative 1 %   Eosinophils Absolute 0.1 0.0 - 0.5 K/uL   Basophils Relative 0 %   Basophils Absolute 0.0 0.0 - 0.1 K/uL   Immature Granulocytes 0 %   Abs Immature Granulocytes 0.03 0.00 - 0.07 K/uL    Comment: Performed at [facility], [place] [address] [place], [place] [address]  Comprehensive metabolic panel     Status: Abnormal   Collection Time: 02/16/19  1:50 AM  Result Value Ref Range   Sodium 135 135 - 145 mmol/L   Potassium 3.3 (L) 3.5 - 5.1 mmol/L   Chloride 101 98 - 111 mmol/L   CO2 22 22 - 32 mmol/L   Glucose, Bld 128 (H) 70 - 99 mg/dL   BUN 10 6 - 20 mg/dL   Creatinine, Ser 0.75 0.44 - 1.00 mg/dL   Calcium 8.9 8.9 - 10.3 mg/dL   Total Protein 6.6 6.5 - 8.1 g/dL   Albumin 3.9 3.5 - 5.0 g/dL   AST 15 15 - 41 U/L   ALT 16 0 - 44 U/L   Alkaline Phosphatase 49 38 - 126 U/L   Total Bilirubin 0.4 0.3 - 1.2 mg/dL   GFR calc non Af Amer >60 >60 mL/min   GFR calc Af Amer >60 >60 mL/min   Anion gap 12 5 - 15    Comment: Performed at [facility], [place] [address], [place], [place] [address]  I-Stat Beta hCG blood, ED ([facility], [facility], [facility] only)     Status: None   Collection Time: 02/16/19  1:59 AM  Result Value Ref Range   I-stat hCG, quantitative <5.0 <5 mIU/mL   Comment 3            Comment:   GEST. AGE      CONC.  (mIU/mL)   <=1 WEEK        5 - 50     2 WEEKS       50 - 500     3 WEEKS       100 - 10,000     4 WEEKS     1,000 - 30,000        FEMALE  AND NON-PREGNANT FEMALE:     LESS THAN 5 mIU/mL   Urinalysis, Routine w reflex microscopic     Status: Abnormal   Collection Time: 02/16/19  4:15 AM  Result Value Ref Range   Color, Urine STRAW (A) YELLOW   APPearance CLEAR CLEAR   Specific Gravity, Urine 1.005 1.005 - 1.030   pH 6.0 5.0 - 8.0   Glucose, UA NEGATIVE NEGATIVE mg/dL   Hgb urine dipstick NEGATIVE NEGATIVE   Bilirubin Urine NEGATIVE NEGATIVE   Ketones, ur NEGATIVE NEGATIVE mg/dL   Protein, ur NEGATIVE NEGATIVE mg/dL   Nitrite NEGATIVE NEGATIVE   Leukocytes,Ua NEGATIVE NEGATIVE    Comment: Performed at [facility], [place] [address] [place], [place] [address]  TSH     Status: None   Collection Time: 02/18/19 12:00 AM  Result Value Ref Range   TSH 1.18 0.41 - 5.90  Urinalysis, Routine w reflex microscopic     Status: Abnormal   Collection Time: 02/19/19 11:47 PM  Result Value Ref Range   Color, Urine YELLOW YELLOW   APPearance CLEAR CLEAR   Specific Gravity, Urine 1.012 1.005 - 1.030   pH 6.0 5.0 - 8.0   Glucose, UA NEGATIVE NEGATIVE mg/dL  Hgb urine dipstick NEGATIVE NEGATIVE   Bilirubin Urine NEGATIVE NEGATIVE   Ketones, ur 20 (A) NEGATIVE mg/dL   Protein, ur NEGATIVE NEGATIVE mg/dL   Nitrite NEGATIVE NEGATIVE   Leukocytes,Ua NEGATIVE NEGATIVE    Comment: Performed at [facility] [address], [place], [place] [id number]  Basic metabolic panel     Status: Abnormal   Collection Time: 02/20/19 12:01 AM  Result Value Ref Range   Sodium 138 135 - 145 mmol/L   Potassium 3.3 (L) 3.5 - 5.1 mmol/L   Chloride 103 98 - 111 mmol/L   CO2 24 22 - 32 mmol/L   Glucose, Bld 101 (H) 70 - 99 mg/dL   BUN 5 (L) 6 - 20 mg/dL   Creatinine, Ser 0.66 0.44 - 1.00 mg/dL   Calcium 9.3 8.9 - 10.3 mg/dL   GFR calc non Af Amer >60 >60 mL/min   GFR calc Af Amer >60 >60 mL/min   Anion gap 11 5 - 15    Comment: Performed at [facility], [place] [address] [place], [place] [address]  CBC     Status: None    Collection Time: 02/20/19 12:01 AM  Result Value Ref Range   WBC 9.7 4.0 - 10.5 K/uL   RBC 4.11 3.87 - 5.11 MIL/uL   Hemoglobin 12.6 12.0 - 15.0 g/dL   HCT 39.2 36.0 - 46.0 %   MCV 95.4 80.0 - 100.0 fL   MCH 30.7 26.0 - 34.0 pg   MCHC 32.1 30.0 - 36.0 g/dL   RDW 12.2 11.5 - 15.5 %   Platelets 245 150 - 400 K/uL   nRBC 0.0 0.0 - 0.2 %    Comment: Performed at [facility], [place] [address], [place], [place] [address]  I-Stat beta hCG blood, ED     Status: None   Collection Time: 02/20/19 12:04 AM  Result Value Ref Range   I-stat hCG, quantitative <5.0 <5 mIU/mL   Comment 3            Comment:   GEST. AGE      CONC.  (mIU/mL)   <=1 WEEK        5 - 50     2 WEEKS       50 - 500     3 WEEKS       100 - 10,000     4 WEEKS     1,000 - 30,000        FEMALE AND NON-PREGNANT FEMALE:     LESS THAN 5 mIU/mL   Cytology - PAP     Status: None   Collection Time: 03/05/19  3:18 PM  Result Value Ref Range   Chlamydia Negative    Neisseria Gonorrhea Negative    Adequacy      Satisfactory for evaluation; transformation zone component PRESENT.   Diagnosis      - Negative for intraepithelial lesion or malignancy (NILM)   Molecular Disclaimer      APTIMA Combo 2 assay on the Panther  system is a target amplification   Molecular Disclaimer      nucleic acid probe test that utilizes target capture for in vitro   Molecular Disclaimer      qualitative detection and differentiation of ribosomal RNA from   Molecular Disclaimer      Chlamydia trachomatis (CT) and/or Neisseria gonorrhoeae (NG).  This test   Molecular Disclaimer      was validated and its appropriate  performance characteristics determined   [employer] fast streamer      by the reporting laboratory. This laboratory is certified under the   [place] as qualified to perform high complexity clinical Insurance account manager testing. Normal Reference Range-Negative   CBC     Status: None   Collection  Time: 03/12/19 10:12 AM  Result Value Ref Range   WBC 6.5 4.0 - 10.5 K/uL   RBC 4.30 3.87 - 5.11 MIL/uL   Hemoglobin 13.3 12.0 - 15.0 g/dL   HCT 40.9 36.0 - 46.0 %   MCV 95.1 80.0 - 100.0 fL   MCH 30.9 26.0 - 34.0 pg   MCHC 32.5 30.0 - 36.0 g/dL   RDW 12.5 11.5 - 15.5 %   Platelets 246 150 - 400 K/uL   nRBC 0.0 0.0 - 0.2 %    Comment: Performed at [facility], [place] [address] [place], [place] [id number]  Basic metabolic panel     Status: Abnormal   Collection Time: 03/12/19 10:12 AM  Result Value Ref Range   Sodium 139 135 - 145 mmol/L   Potassium 3.4 (L) 3.5 - 5.1 mmol/L   Chloride 102 98 - 111 mmol/L   CO2 25 22 - 32 mmol/L   Glucose, Bld 113 (H) 70 - 99 mg/dL   BUN 5 (L) 6 - 20 mg/dL   Creatinine, Ser 0.76 0.44 - 1.00 mg/dL   Calcium 9.3 8.9 - 10.3 mg/dL   GFR calc non Af Amer >60 >60 mL/min   GFR calc Af Amer >60 >60 mL/min   Anion gap 12 5 - 15    Comment: Performed at [facility], [place] [address], [place], [place] [address]  I-Stat Beta hCG blood, ED ([facility], [facility], [facility] only)     Status: None   Collection Time: 03/12/19 10:16 AM  Result Value Ref Range   I-stat hCG, quantitative <5.0 <5 mIU/mL   Comment 3            Comment:   GEST. AGE      CONC.  (mIU/mL)   <=1 WEEK        5 - 50     2 WEEKS       50 - 500     3 WEEKS       100 - 10,000     4 WEEKS     1,000 - 30,000        FEMALE AND NON-PREGNANT FEMALE:     LESS THAN 5 mIU/mL      PHQ2/9: Depression screen [facility] 2/9 04/11/2019 03/11/2019 03/11/2019 01/17/2019 12/12/2018  Decreased Interest 0 3 3 0 0  Down, Depressed, Hopeless 0 3 3 0 0  PHQ - 2 Score 0 6 6 0 0  Altered sleeping 1 2 2  0 0  Tired, decreased energy 0 3 3 0 0  Change in appetite 0 3 3 0 0  Feeling bad or failure about yourself  0 2 2 0 0  Trouble concentrating 0 1 1 0 0  Moving slowly or fidgety/restless 0 0 0 0 0  Suicidal thoughts 0 0 0 0 0  PHQ-9 Score 1 17 17  0 0  Difficult doing work/chores Not difficult at all - Very difficult - Not  difficult at all  Some recent data might be hidden    phq 9 is positive   Fall Risk: Fall Risk  04/11/2019 03/11/2019 03/05/2019 01/17/2019 12/12/2018  Falls in the past year? 0 0 0 0 0  Number falls in past yr: 0 0 0 0 0  Injury with Fall? 0 0 0 0 0  Follow up - - - - -     Functional Status Survey: Is the patient deaf or have difficulty hearing?: No Does the patient have difficulty seeing, even when wearing glasses/contacts?: No Does the patient have difficulty concentrating, remembering, or making decisions?: No Does the patient have difficulty walking or climbing stairs?: No Does the patient have difficulty dressing or bathing?: No Does the patient have difficulty doing errands alone such as visiting a doctor's office or shopping?: No   Assessment & Plan  1. Depression with anxiety  Doing much better    2. Posterior auricular lymphadenopathy  Keep follow up with ENT

## 2019-04-16 ENCOUNTER — Other Ambulatory Visit: Payer: Self-pay

## 2019-04-16 ENCOUNTER — Ambulatory Visit
Admission: RE | Admit: 2019-04-16 | Discharge: 2019-04-16 | Disposition: A | Discharge status: Home / Self Care | Payer: Medicare Other | Source: Ambulatory Visit | Attending: Otolaryngology | Admitting: Otolaryngology

## 2019-04-16 DIAGNOSIS — R221 Localized swelling, mass and lump, neck: Secondary | ICD-10-CM | POA: Insufficient documentation

## 2019-06-18 ENCOUNTER — Other Ambulatory Visit: Payer: Self-pay | Admitting: Cardiology

## 2019-06-18 DIAGNOSIS — Z20822 Contact with and (suspected) exposure to covid-19: Secondary | ICD-10-CM

## 2019-06-19 LAB — NOVEL CORONAVIRUS, NAA: SARS-CoV-2, NAA: NOT DETECTED

## 2019-07-23 ENCOUNTER — Ambulatory Visit: Payer: Self-pay | Admitting: *Deleted

## 2019-07-23 ENCOUNTER — Other Ambulatory Visit: Payer: Self-pay

## 2019-07-23 ENCOUNTER — Encounter: Payer: Self-pay | Admitting: Family Medicine

## 2019-07-23 ENCOUNTER — Ambulatory Visit: Payer: Self-pay | Admitting: Family Medicine

## 2019-07-23 ENCOUNTER — Ambulatory Visit (INDEPENDENT_AMBULATORY_CARE_PROVIDER_SITE_OTHER): Payer: Medicare Other | Admitting: Family Medicine

## 2019-07-23 VITALS — BP 110/58 | HR 89 | Temp 98.5°F | Resp 14 | Ht 64.0 in | Wt 151.9 lb

## 2019-07-23 DIAGNOSIS — R739 Hyperglycemia, unspecified: Secondary | ICD-10-CM

## 2019-07-23 DIAGNOSIS — R002 Palpitations: Secondary | ICD-10-CM

## 2019-07-23 DIAGNOSIS — R000 Tachycardia, unspecified: Secondary | ICD-10-CM

## 2019-07-23 DIAGNOSIS — F439 Reaction to severe stress, unspecified: Secondary | ICD-10-CM | POA: Diagnosis not present

## 2019-07-23 DIAGNOSIS — E876 Hypokalemia: Secondary | ICD-10-CM

## 2019-07-23 NOTE — Progress Notes (Signed)
Patient ID: [name], female    DOB: 01-Apr-1981, 39 y.o.   MRN: [id number]  PCP: Steele Sizer, MD  Chief Complaint  Patient presents with  . Irregular Heart Beat    episode last night, denies sob, or chest pain.  May be due to stress or anxiety    Subjective:   [name] is a 39 y.o. female, presents to clinic with CC of the following:  Palpitations  This is a recurrent problem. The current episode started yesterday. The problem occurs intermittently. On average, each episode lasts 1 hour. The symptoms are aggravated by stress (home stress with kids and virtual school, problems with assignments). Associated symptoms include anxiety and shortness of breath. Pertinent negatives include no chest fullness, chest pain, coughing, diaphoresis, dizziness, fever, irregular heartbeat, malaise/fatigue, nausea, near-syncope, numbness, syncope, vomiting or weakness. The treatment provided no relief.   Pt had rapid heart rate last night after showering and before bed One other episode that she felt was related to stress.   She is home alone all day with multiple children who are doing virtual school she is limited on how much she can help them due to deafness.  She overall feels increased stress worry and sometimes is agitated and more upset.  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, her mother is here helping do sign language for the patient mother denies any past medical history of cardiac disease illness or consults  GAD 7 : Generalized Anxiety Score 07/23/2019 04/11/2019 03/11/2019 03/05/2019  Nervous, Anxious, on Edge 0 1 3 0  Control/stop worrying 0 0 3 1  Worry too much - different things 0 0 1 1  Trouble relaxing 0 0 1 0  Restless 0 0 1 0  Easily annoyed or irritable 1 1 2 2   Afraid - awful might happen 0 0 - 0  Total GAD 7 Score 1 2 - 4  Anxiety Difficulty Not difficult at all Not difficult at all - -   Depression screen [facility] 2/9 07/23/2019  04/11/2019 03/11/2019  Decreased Interest 3 0 3  Down, Depressed, Hopeless 1 0 3  PHQ - 2 Score 4 0 6  Altered sleeping 0 1 2  Tired, decreased energy 1 0 3  Change in appetite 0 0 3  Feeling bad or failure about yourself  0 0 2  Trouble concentrating 0 0 1  Moving slowly or fidgety/restless 0 0 0  Suicidal thoughts 0 0 0  PHQ-9 Score 5 1 17   Difficult doing work/chores Not difficult at all Not difficult at all -  Some recent data might be hidden   Her main symptom is that she is little bit irritable and feels stressed she denies any depressive thoughts, significant changes in appetite energy sleep patterns. Denies any associated headaches, near syncope, lower extremity edema, orthopnea, PND   Patient Active Problem List   Diagnosis Date Noted  . S/P cesarean section 01/13/2013  . Bilateral deafness 01/12/2013      Current Outpatient Medications:  .  levonorgestrel (MIRENA) 20 MCG/24HR IUD, 1 each by Intrauterine route once., Disp: , Rfl:    Allergies  Allergen Reactions  . Lexapro [Escitalopram Oxalate] Nausea Only and Hypertension  . Nickel Rash     Family History  Problem Relation Age of Onset  . Breast cancer Mother        pt doesnt remember  . Diabetes Father   . Hypertension Brother   . Breast  cancer Paternal Grandmother   . Breast cancer Maternal Aunt   . Heart attack Paternal Aunt   . Prostate cancer Paternal Uncle      Social History   Socioeconomic History  . Marital status: Married    Spouse name: Jenet Pelc  . Number of children: 3  . Years of education: Not on file  . Highest education level: Associate degree: academic program  Occupational History  . Occupation: disabled     Comment: hearing  Tobacco Use  . Smoking status: Never Smoker  . Smokeless tobacco: Never Used  Substance and Sexual Activity  . Alcohol use: Not Currently    Alcohol/week: 1.0 standard drinks    Types: 1 Glasses of wine per week  . Drug use: Never  . Sexual  activity: Yes    Partners: Male    Birth control/protection: I.U.D.  Other Topics Concern  . Not on file  Social History Narrative   Married with 3 children, stay at home with her children   Social Determinants of Health   Financial Resource Strain: Low Risk   . Difficulty of Paying Living Expenses: Not very hard  Food Insecurity: Food Insecurity Present  . Worried About Charity fundraiser in the Last Year: Not on file  . Ran Out of Food in the Last Year: Sometimes true  Transportation Needs:   . Lack of Transportation (Medical): Not on file  . Lack of Transportation (Non-Medical): Not on file  Physical Activity:   . Days of Exercise per Week: Not on file  . Minutes of Exercise per Session: Not on file  Stress: Stress Concern Present  . Feeling of Stress : To some extent  Social Connections: Unknown  . Frequency of Communication with Friends and Family: Not on file  . Frequency of Social Gatherings with Friends and Family: Three times a week  . Attends Religious Services: Never  . Active Member of Clubs or Organizations: Not on file  . Attends Archivist Meetings: Not on file  . Marital Status: Not on file  Intimate Partner Violence:   . Fear of Current or Ex-Partner: Not on file  . Emotionally Abused: Not on file  . Physically Abused: Not on file  . Sexually Abused: Not on file    Chart Review Today: I personally reviewed active problem list, medication list, allergies, family history, social history, health maintenance, notes from last encounter, lab results, imaging with the patient/caregiver today.   Review of Systems  Constitutional: Negative.  Negative for activity change, appetite change, diaphoresis, fatigue, fever, malaise/fatigue and unexpected weight change.  HENT: Negative.   Eyes: Negative.   Respiratory: Positive for shortness of breath. Negative for apnea, cough, choking, chest tightness and wheezing.   Cardiovascular: Positive for palpitations.  Negative for chest pain, leg swelling, syncope and near-syncope.  Gastrointestinal: Negative.  Negative for abdominal pain, blood in stool, nausea and vomiting.  Endocrine: Negative.  Negative for cold intolerance, heat intolerance, polydipsia, polyphagia and polyuria.  Genitourinary: Negative.   Musculoskeletal: Negative.  Negative for arthralgias, gait problem, joint swelling and myalgias.  Skin: Negative.  Negative for color change, pallor and rash.  Allergic/Immunologic: Negative.   Neurological: Negative.  Negative for dizziness, syncope, weakness and numbness.  Hematological: Negative.  Negative for adenopathy. Does not bruise/bleed easily.  Psychiatric/Behavioral: Negative for confusion, dysphoric mood, self-injury and suicidal ideas. The patient is nervous/anxious.   All other systems reviewed and are negative.      Objective:   Vitals:  07/23/19 1347  BP: (!) 110/58  Pulse: 89  Resp: 14  Temp: 98.5 F (36.9 C)  SpO2: 99%  Weight: 151 lb 14.4 oz (68.9 kg)  Height: 5\' 4"  (1.626 m)    Body mass index is 26.07 kg/m.  Physical Exam Vitals and nursing note reviewed.  Constitutional:      General: She is not in acute distress.    Appearance: Normal appearance. She is well-developed. She is not ill-appearing, toxic-appearing or diaphoretic.     Interventions: Face mask in place.  HENT:     Head: Normocephalic and atraumatic.     Right Ear: External ear normal.     Left Ear: External ear normal.     Nose: Nose normal.     Mouth/Throat:     Mouth: Mucous membranes are moist.     Pharynx: Oropharynx is clear.  Eyes:     General: Lids are normal. No scleral icterus.       Right eye: No discharge.        Left eye: No discharge.     Conjunctiva/sclera: Conjunctivae normal.  Neck:     Trachea: Phonation normal. No tracheal deviation.  Cardiovascular:     Rate and Rhythm: Normal rate and regular rhythm.     Pulses: Normal pulses.          Radial pulses are 2+ on the  right side and 2+ on the left side.       Posterior tibial pulses are 2+ on the right side and 2+ on the left side.     Heart sounds: Normal heart sounds. No murmur. No friction rub. No gallop.   Pulmonary:     Effort: Pulmonary effort is normal. No respiratory distress.     Breath sounds: Normal breath sounds. No stridor. No wheezing, rhonchi or rales.  Chest:     Chest wall: No tenderness.  Abdominal:     General: Bowel sounds are normal. There is no distension.     Palpations: Abdomen is soft.     Tenderness: There is no abdominal tenderness. There is no guarding or rebound.  Musculoskeletal:        General: No deformity. Normal range of motion.     Cervical back: Normal range of motion and neck supple.     Right lower leg: No edema.     Left lower leg: No edema.  Lymphadenopathy:     Cervical: No cervical adenopathy.  Skin:    General: Skin is warm and dry.     Capillary Refill: Capillary refill takes less than 2 seconds.     Coloration: Skin is not jaundiced or pale.     Findings: No rash.  Neurological:     Mental Status: She is alert and oriented to person, place, and time.     Motor: No abnormal muscle tone.     Gait: Gait normal.  Psychiatric:        Mood and Affect: Mood normal.        Speech: Speech normal.        Behavior: Behavior normal.      Results for orders placed or performed in visit on 06/18/19  Novel Coronavirus, NAA ([facility])   Specimen: Nasopharyngeal(NP) swabs in vial transport medium   NASOPHARYNGE  SCREENIN  Result Value Ref Range   SARS-CoV-2, NAA Not Detected Not Detected        Assessment & Plan:      ICD-10-CM   1. Palpitation  R00.2 EKG  12-Lead    CBC with Differential/Platelet    COMPLETE METABOLIC PANEL WITH GFR    TSH    Hemoglobin A1c    Urinalysis, Routine w reflex microscopic   2 episodes in the past both related to stress and anxiety, EKG reassuring we will check labs, patient declines cardiology referral at this time  2.  Tachycardia  R00.0 CBC with Differential/Platelet    COMPLETE METABOLIC PANEL WITH GFR    TSH    Hemoglobin A1c    Urinalysis, Routine w reflex microscopic  3. Hypokalemia  [id number] COMPLETE METABOLIC PANEL WITH GFR   Past abnormal labs will recheck today  4. Hyperglycemia  [id number] COMPLETE METABOLIC PANEL WITH GFR    Hemoglobin A1c   Past abnormal labs will recheck today will rule out new onset diabetes  5. Situational stress  F43.9    Patient endorses her symptoms being secondary to acute situational stress with her children at home in virtual school, she is unable to help very much    Pt does feel that her symptoms are from stress, two long episodes recently, she has no other associated symptoms concerning for cardiac etiology but will do labs to ensure there is no electrolyte abnormality, thyroid dysfunction, infection, dehydration or anemia Patient declines any cardiology referral and even after discussion of Holter monitoring it may be helpful to try and catch her associate any of her symptoms to any possible EKG changes.  She shakes her head and states that her symptoms are likely due to her children. We discussed coping mechanisms and techniques, she was encouraged to make some time for herself to relax, exercise to work through some of her frustrations, she declined any referral to psychiatry or therapy at this time Trial of buspar prn for stressful days F/up with PCP or me for med refill or if any worsening symptoms    Delsa Grana, PA-C 07/23/19 2:25 PM

## 2019-07-23 NOTE — Telephone Encounter (Signed)
Patient calls with sign language interpreter. She experienced her heart beating faster than normal last night for approximately 1.5 hours. Denies pain/sweating and dizziness with that event. Felt SOB briefly when it occurred-none since. Feels normal this morning. This occurred one other time 2 weeks ago. Denies any strenuous activity prior to the event. Also, she notices a pulse in her left lower leg at times. No swelling/pain/discoloration. No fever/contacts/travels. DT=OV okay. No availabilty with PCP today. Scheduled for this afternoon 1:30p with L. Lucio Edward, [place]. Reviewed with patient if she worsens call 911.   Reason for Disposition . [1] Heart beating very rapidly (e.g., > 140 / minute) AND [2] not present now  (Exception: during exercise)  Answer Assessment - Initial Assessment Questions 1. DESCRIPTION: "Please describe your heart rate or heart beat that you are having" (e.g., fast/slow, regular/irregular, skipped or extra beats, "palpitations")    Fast heartrate 2. ONSET: "When did it start?" (Minutes, hours or days)     Last night for about 1.5 hours 3. DURATION: "How long does it last" (e.g., seconds, minutes, hours)     Hour and a half 4. PATTERN "Does it come and go, or has it been constant since it started?"  "Does it get worse with exertion?"   "Are you feeling it now?"    Occurred once 5. TAP: "Using your hand, can you tap out what you are feeling on a chair or table in front of you, so that I can hear?" (Note: not all patients can do this)       no 6. HEART RATE: "Can you tell me your heart rate?" "How many beats in 15 seconds?"  (Note: not all patients can do this)      no 7. RECURRENT SYMPTOM: "Have you ever had this before?" If so, ask: "When was the last time?" and "What happened that time?"      no 8. CAUSE: "What do you think is causing the palpitations?"     Anxiety? 9. CARDIAC HISTORY: "Do you have any history of heart disease?" (e.g., heart attack, angina, bypass surgery,  angioplasty, arrhythmia)      htn 10. OTHER SYMPTOMS: "Do you have any other symptoms?" (e.g., dizziness, chest pain, sweating, difficulty breathing)      no 11. PREGNANCY: "Is there any chance you are pregnant?" "When was your last menstrual period?"       no  Protocols used: HEART RATE AND HEARTBEAT QUESTIONS-A-AH

## 2019-07-23 NOTE — Patient Instructions (Signed)
Sinus Tachycardia  Sinus tachycardia is a kind of fast heartbeat. In sinus tachycardia, the heart beats more than 100 times a minute. Sinus tachycardia starts in a part of the heart called the sinus node. Sinus tachycardia may be harmless, or it may be a sign of a serious condition. What are the causes? This condition may be caused by:  Exercise or exertion.  A fever.  Pain.  Loss of body fluids (dehydration).  Severe bleeding (hemorrhage).  Anxiety and stress.  Certain substances, including: ? Alcohol. ? Caffeine. ? Tobacco and nicotine products. ? Cold medicines. ? Illegal drugs.  Medical conditions including: ? Heart disease. ? An infection. ? An overactive thyroid (hyperthyroidism). ? A lack of red blood cells (anemia). What are the signs or symptoms? Symptoms of this condition include:  A feeling that the heart is beating quickly (palpitations).  Suddenly noticing your heartbeat (cardiac awareness).  Dizziness.  Tiredness (fatigue).  Shortness of breath.  Chest pain.  Nausea.  Fainting. How is this diagnosed? This condition is diagnosed with:  A physical exam.  Other tests, such as: ? Blood tests. ? An electrocardiogram (ECG). This test measures the electrical activity of the heart. ? Ambulatory cardiac monitor. This records your heartbeats for 24 hours or more. You may be referred to a heart specialist (cardiologist). How is this treated? Treatment for this condition depends on the cause or the underlying condition. Treatment may involve:  Treating the underlying condition.  Taking new medicines or changing your current medicines as told by your health care provider.  Making changes to your diet or lifestyle. Follow these instructions at home: Lifestyle   Do not use any products that contain nicotine or tobacco, such as cigarettes and e-cigarettes. If you need help quitting, ask your health care provider.  Do not use illegal drugs, such as  cocaine.  Learn relaxation methods to help you when you get stressed or anxious. These include deep breathing.  Avoid caffeine or other stimulants. Alcohol use   Do not drink alcohol if: ? Your health care provider tells you not to drink. ? You are pregnant, may be pregnant, or are planning to become pregnant.  If you drink alcohol, limit how much you have: ? 0-1 drink a day for women. ? 0-2 drinks a day for men.  Be aware of how much alcohol is in your drink. In the [place] one drink equals one typical bottle of beer (12 oz), one-half glass of wine (5 oz), or one shot of hard liquor (1 oz). General instructions  Drink enough fluids to keep your urine pale yellow.  Take over-the-counter and prescription medicines only as told by your health care provider.  Keep all follow-up visits as told by your health care provider. This is important. Contact a health care provider if you have:  A fever.  Vomiting or diarrhea that does not go away. Get help right away if you:  Have pain in your chest, upper arms, jaw, or neck.  Become weak or dizzy.  Feel faint.  Have palpitations that do not go away. Summary  In sinus tachycardia, the heart beats more than 100 times a minute.  Sinus tachycardia may be harmless, or it may be a sign of a serious condition.  Treatment for this condition depends on the cause or the underlying condition.  Get help right away if you have pain in your chest, upper arms, jaw, or neck. This information is not intended to replace advice given to you by   your health care provider. Make sure you discuss any questions you have with your health care provider. Document Revised: 07/25/2017 Document Reviewed: 07/25/2017 Elsevier Patient Education  [place].   Ambulatory Cardiac Monitoring An ambulatory cardiac monitor is a small recording device that is used to detect abnormal heart rhythms (arrhythmias). Most monitors are connected by wires to flat,  sticky disks (electrodes) that are then attached to your chest. You may need to wear a monitor if you have had symptoms such as:  Fast heartbeats (palpitations).  Dizziness.  Fainting or light-headedness.  Unexplained weakness.  Shortness of breath. There are several types of monitors. Some common monitors include:  Holter monitor. This records your heart rhythm continuously, usually for 24-48 hours.  Event (episodic) monitor. This monitor has a symptoms button, and when pushed, it will begin recording. You need to activate this monitor to record when you have a heart-related symptom.  Automatic detection monitor. This monitor will begin recording when it detects an abnormal heartbeat. What are the risks? Generally, these devices are safe to use. However, it is possible that the skin under the electrodes will become irritated. How to prepare for monitoring Your health care provider will prepare your chest for the electrode placement and show you how to use the monitor.  Do not apply lotions to your chest before monitoring.  Follow directions on how to care for the monitor, and how to return the monitor when the testing period is complete. How to use your cardiac monitor  Follow directions about how long to wear the monitor, and if you can take the monitor off in order to shower or bathe. ? Do not let the monitor get wet. ? Do not bathe, swim, or use a hot tub while wearing the monitor.  Keep your skin clean. Do not put body lotion or moisturizer on your chest.  Change the electrodes as told by your health care provider, or any time they stop sticking to your skin. You may need to use medical tape to keep them on.  Try to put the electrodes in slightly different places on your chest to help prevent skin irritation. Follow directions from your health care provider about where to place the electrodes.  Make sure the monitor is safely clipped to your clothing or in a location close  to your body as recommended by your health care provider.  If your monitor has a symptoms button, press the button to mark an event as soon as you feel a heart-related symptom, such as: ? Dizziness. ? Weakness. ? Light-headedness. ? Palpitations. ? Thumping or pounding in your chest. ? Shortness of breath. ? Unexplained weakness.  Keep a diary of your activities, such as walking, doing chores, and taking medicine. It is very important to note what you were doing when you pushed the button to record your symptoms. This will help your health care provider determine what might be contributing to your symptoms.  Send the recorded information as recommended by your health care provider. It may take some time for your health care provider to process the results.  Change the batteries as told by your health care provider.  Keep electronic devices away from your monitor. These include: ? Tablets. ? MP3 players. ? Cell phones.  While wearing your monitor you should avoid: ? Electric blankets. ? Armed forces operational officer. ? Electric toothbrushes. ? Microwave ovens. ? Magnets. ? Metal detectors. Get help right away if:  You have chest pain.  You have shortness of  breath or extreme difficulty breathing.  You develop a very fast heartbeat that does not get better.  You develop dizziness that does not go away.  You faint or constantly feel like you are about to faint. Summary  An ambulatory cardiac monitor is a small recording device that is used to detect abnormal heart rhythms (arrhythmias).  Make sure you understand how to send the information from the monitor to your health care provider.  It is important to press the button on the monitor when you have any heart-related symptoms.  Keep a diary of your activities, such as walking, doing chores, and taking medicine. It is very important to note what you were doing when you pushed the button to record your symptoms. This will help your  health care provider learn what might be causing your symptoms. This information is not intended to replace advice given to you by your health care provider. Make sure you discuss any questions you have with your health care provider. Document Revised: 05/18/2017 Document Reviewed: 05/20/2016 Elsevier Patient Education  [place].    Palpitations Palpitations are feelings that your heartbeat is not normal. Your heartbeat may feel like it is:  Uneven.  Faster than normal.  Fluttering.  Skipping a beat. This is usually not a serious problem. In some cases, you may need tests to rule out any serious problems. Follow these instructions at home: Pay attention to any changes in your condition. Take these actions to help manage your symptoms: Eating and drinking  Avoid: ? Coffee, tea, soft drinks, and energy drinks. ? Chocolate. ? Alcohol. ? Diet pills. Lifestyle   Try to lower your stress. These things can help you relax: ? Yoga. ? Deep breathing and meditation. ? Exercise. ? Using words and images to create positive thoughts (guided imagery). ? Using your mind to control things in your body (biofeedback).  Do not use drugs.  Get plenty of rest and sleep. Keep a regular bed time. General instructions   Take over-the-counter and prescription medicines only as told by your doctor.  Do not use any products that contain nicotine or tobacco, such as cigarettes and e-cigarettes. If you need help quitting, ask your doctor.  Keep all follow-up visits as told by your doctor. This is important. You may need more tests if palpitations do not go away or get worse. Contact a doctor if:  Your symptoms last more than 24 hours.  Your symptoms occur more often. Get help right away if you:  Have chest pain.  Feel short of breath.  Have a very bad headache.  Feel dizzy.  Pass out (faint). Summary  Palpitations are feelings that your heartbeat is uneven or faster than  normal. It may feel like your heart is fluttering or skipping a beat.  Avoid food and drinks that may cause palpitations. These include caffeine, chocolate, and alcohol.  Try to lower your stress. Do not smoke or use drugs.  Get help right away if you faint or have chest pain, shortness of breath, a severe headache, or dizziness. This information is not intended to replace advice given to you by your health care provider. Make sure you discuss any questions you have with your health care provider. Document Revised: 07/18/2017 Document Reviewed: 07/18/2017 Elsevier Patient Education  2020 [employer].

## 2019-07-24 ENCOUNTER — Other Ambulatory Visit: Payer: Self-pay | Admitting: Family Medicine

## 2019-07-24 LAB — COMPLETE METABOLIC PANEL WITH GFR
AG Ratio: 1.7 (calc) (ref 1.0–2.5)
ALT: 10 U/L (ref 6–29)
AST: 13 U/L (ref 10–30)
Albumin: 4.5 g/dL (ref 3.6–5.1)
Alkaline phosphatase (APISO): 42 U/L (ref 31–125)
BUN: 10 mg/dL (ref 7–25)
CO2: 28 mmol/L (ref 20–32)
Calcium: 9.5 mg/dL (ref 8.6–10.2)
Chloride: 104 mmol/L (ref 98–110)
Creat: 0.68 mg/dL (ref 0.50–1.10)
GFR, Est African American: 130 mL/min/{1.73_m2} (ref >=60)
GFR, Est Non African American: 113 mL/min/{1.73_m2} (ref >=60)
Globulin: 2.6 g/dL (calc) (ref 1.9–3.7)
Glucose, Bld: 88 mg/dL (ref 65–99)
Potassium: 3.8 mmol/L (ref 3.5–5.3)
Sodium: 139 mmol/L (ref 135–146)
Total Bilirubin: 0.4 mg/dL (ref 0.2–1.2)
Total Protein: 7.1 g/dL (ref 6.1–8.1)

## 2019-07-24 LAB — CBC WITH DIFFERENTIAL/PLATELET
Absolute Monocytes: 686 cells/uL (ref 200–950)
Basophils Absolute: 18 cells/uL (ref 0–200)
Basophils Relative: 0.2 %
Eosinophils Absolute: 141 cells/uL (ref 15–500)
Eosinophils Relative: 1.6 %
HCT: 40.6 % (ref 35.0–45.0)
Hemoglobin: 13.5 g/dL (ref 11.7–15.5)
Lymphs Abs: 1866 cells/uL (ref 850–3900)
MCH: 31.3 pg (ref 27.0–33.0)
MCHC: 33.3 g/dL (ref 32.0–36.0)
MCV: 94 fL (ref 80.0–100.0)
MPV: 10.3 fL (ref 7.5–12.5)
Monocytes Relative: 7.8 %
Neutro Abs: 6090 cells/uL (ref 1500–7800)
Neutrophils Relative %: 69.2 %
Platelets: 225 10*3/uL (ref 140–400)
RBC: 4.32 10*6/uL (ref 3.80–5.10)
RDW: 11.5 % (ref 11.0–15.0)
Total Lymphocyte: 21.2 %
WBC: 8.8 10*3/uL (ref 3.8–10.8)

## 2019-07-24 LAB — URINALYSIS, ROUTINE W REFLEX MICROSCOPIC
Bilirubin Urine: NEGATIVE
Glucose, UA: NEGATIVE
Hgb urine dipstick: NEGATIVE
Ketones, ur: NEGATIVE
Leukocytes,Ua: NEGATIVE
Nitrite: NEGATIVE
Protein, ur: NEGATIVE
Specific Gravity, Urine: 1.025 (ref 1.001–1.03)
pH: 6.5 (ref 5.0–8.0)

## 2019-07-24 LAB — HEMOGLOBIN A1C
Hgb A1c MFr Bld: 5.5 % of total Hgb (ref <5.7)
Mean Plasma Glucose: 111 (calc)
eAG (mmol/L): 6.2 (calc)

## 2019-07-24 LAB — TSH: TSH: 1.18 mIU/L

## 2019-07-24 MED ORDER — BUSPIRONE HCL 5 MG PO TABS: 5.0000 mg | ORAL_TABLET | Freq: Two times a day (BID) | ORAL | 1 refills | Status: DC | PRN

## 2019-09-01 DIAGNOSIS — H9190 Unspecified hearing loss, unspecified ear: Secondary | ICD-10-CM | POA: Diagnosis not present

## 2019-09-01 DIAGNOSIS — H538 Other visual disturbances: Secondary | ICD-10-CM | POA: Diagnosis not present

## 2019-09-23 DIAGNOSIS — D485 Neoplasm of uncertain behavior of skin: Secondary | ICD-10-CM | POA: Diagnosis not present

## 2019-09-29 ENCOUNTER — Ambulatory Visit (INDEPENDENT_AMBULATORY_CARE_PROVIDER_SITE_OTHER): Payer: Medicare Other | Admitting: Dermatology

## 2019-09-29 ENCOUNTER — Telehealth: Payer: Self-pay | Admitting: Family Medicine

## 2019-09-29 ENCOUNTER — Other Ambulatory Visit: Payer: Self-pay

## 2019-09-29 ENCOUNTER — Other Ambulatory Visit: Payer: Self-pay | Admitting: Family Medicine

## 2019-09-29 DIAGNOSIS — L988 Other specified disorders of the skin and subcutaneous tissue: Secondary | ICD-10-CM

## 2019-09-29 DIAGNOSIS — I8393 Asymptomatic varicose veins of bilateral lower extremities: Secondary | ICD-10-CM

## 2019-09-29 DIAGNOSIS — L700 Acne vulgaris: Secondary | ICD-10-CM

## 2019-09-29 DIAGNOSIS — R590 Localized enlarged lymph nodes: Secondary | ICD-10-CM

## 2019-09-29 MED ORDER — ADAPALENE 0.3 % EX GEL: CUTANEOUS | 3 refills | Status: DC

## 2019-09-29 MED ORDER — DAPSONE 7.5 % EX GEL: 1.0000 "application " | Freq: Every morning | CUTANEOUS | 3 refills | Status: DC

## 2019-09-29 NOTE — Progress Notes (Signed)
   Follow-Up Visit   Subjective  [name] is a 39 y.o. female who presents for the following: Acne (patient isn't using anything right now for acne, but has tried topical treatment in the past as well as Doxycycline 50mg  po QD.), veins (B/L legs - patient would like to discuss treatment options), and rhytide (across the forehead - patient would like to discuss treatment options). The patient is deaf and has an interpreter who translates for her today.  The following portions of the chart were reviewed this encounter and updated as appropriate: Tobacco  Allergies  Meds  Problems  Med Hx  Surg Hx  Fam Hx     Review of Systems: No other skin or systemic complaints.  Objective  Well appearing patient in no apparent distress; mood and affect are within normal limits.  A focused examination was performed including face and the B/L leg. Relevant physical exam findings are noted in the Assessment and Plan.  Objective  Face: 3 pink papules of the face and closed comedones. Chest and back clear.   Objective  Forehead: Rhytide  Objective  B/L leg: Blue veins   Assessment & Plan  Acne vulgaris Face  Start Differin 0.3% gel QHS and Aczone 7.5% gel QAM.   Adapalene (DIFFERIN) 0.3 % gel - Face  Dapsone (ACZONE) 7.5 % GEL - Face  Rhytides Forehead  Discussed Botox, side effects, and cost with patient. Advised not covered by insurance. Recommend 25 - 30 units for the frown complex and forehead. Discussed fees/cost.  Spider veins of both lower extremities B/L leg  Discussed treatment options and referral to [facility] if painful or symptomatic.  Patient states they are not symptomatic. Advised patient that treatment here at our office is not covered by insurance and there is an out of pocket cost of $350 per session. Pamphlet given today for [place].   Return in about 3 months (around 12/29/2019) for acne follow up.   Luther Redo, CMA, am acting as  scribe for Sarina Ser, MD . Documentation: I have reviewed the above documentation for accuracy and completeness, and I agree with the above.  Sarina Ser, MD

## 2019-09-29 NOTE — Telephone Encounter (Signed)
[name] calling from the [facility] [phone] is calling to request a referral to be placed [facility] [phone]- Fax dx- suspected lympnode no location listed.

## 2019-09-30 ENCOUNTER — Encounter: Payer: Self-pay | Admitting: Dermatology

## 2019-10-06 ENCOUNTER — Telehealth: Payer: Self-pay | Admitting: Family Medicine

## 2019-10-06 NOTE — Telephone Encounter (Signed)
Patient is calling to request a referral to be placed for her 6 week follow up to the [facility]. [phone]

## 2019-10-07 ENCOUNTER — Other Ambulatory Visit: Payer: Self-pay | Admitting: Family Medicine

## 2019-10-07 DIAGNOSIS — R928 Other abnormal and inconclusive findings on diagnostic imaging of breast: Secondary | ICD-10-CM

## 2019-10-08 ENCOUNTER — Other Ambulatory Visit: Payer: Self-pay | Admitting: Family Medicine

## 2019-10-08 DIAGNOSIS — R928 Other abnormal and inconclusive findings on diagnostic imaging of breast: Secondary | ICD-10-CM

## 2019-10-08 DIAGNOSIS — N6489 Other specified disorders of breast: Secondary | ICD-10-CM

## 2019-10-08 NOTE — Telephone Encounter (Signed)
Called patient. LVM that referral for 6 week follow up has been placed.

## 2019-10-13 DIAGNOSIS — R599 Enlarged lymph nodes, unspecified: Secondary | ICD-10-CM | POA: Diagnosis not present

## 2019-10-17 ENCOUNTER — Ambulatory Visit
Admission: RE | Admit: 2019-10-17 | Discharge: 2019-10-17 | Disposition: A | Discharge status: Home / Self Care | Payer: Medicare Other | Source: Ambulatory Visit | Attending: Family Medicine | Admitting: Family Medicine

## 2019-10-17 DIAGNOSIS — R922 Inconclusive mammogram: Secondary | ICD-10-CM | POA: Diagnosis not present

## 2019-10-17 DIAGNOSIS — N6489 Other specified disorders of breast: Secondary | ICD-10-CM | POA: Insufficient documentation

## 2019-10-17 DIAGNOSIS — Z803 Family history of malignant neoplasm of breast: Secondary | ICD-10-CM | POA: Diagnosis not present

## 2019-10-17 DIAGNOSIS — R928 Other abnormal and inconclusive findings on diagnostic imaging of breast: Secondary | ICD-10-CM | POA: Diagnosis not present

## 2019-10-21 ENCOUNTER — Telehealth: Payer: Self-pay

## 2019-10-21 NOTE — Telephone Encounter (Signed)
Patients Adapalene Gel has been denied by insurance. The denial did not list preferred options and I have having a hard time finding her plans formulary since she has [place] prescription benefits but Medicare/Medicaid as insurance.   However her Dapsone PA was approved.

## 2019-10-22 MED ORDER — TRETINOIN 0.05 % EX CREA: TOPICAL_CREAM | Freq: Every day | CUTANEOUS | 0 refills | Status: DC

## 2019-10-22 NOTE — Telephone Encounter (Signed)
Patient has been advised of medication change. Tretinoin PA has been approved.

## 2019-10-22 NOTE — Telephone Encounter (Signed)
Prescription sent in. Called patient this morning but no answer. I will try to call her again.

## 2019-10-22 NOTE — Telephone Encounter (Signed)
May try sending Tretinoin 0.05% cr. If not covered, may advise to try OTC Differin 0.1% gel.

## 2019-10-31 ENCOUNTER — Encounter: Payer: Self-pay | Admitting: Family Medicine

## 2019-10-31 ENCOUNTER — Ambulatory Visit (INDEPENDENT_AMBULATORY_CARE_PROVIDER_SITE_OTHER): Payer: Medicare Other | Admitting: Family Medicine

## 2019-10-31 ENCOUNTER — Other Ambulatory Visit: Payer: Self-pay

## 2019-10-31 VITALS — BP 104/76 | HR 71 | Temp 97.3°F | Resp 16 | Ht 64.0 in | Wt 152.6 lb

## 2019-10-31 DIAGNOSIS — F419 Anxiety disorder, unspecified: Secondary | ICD-10-CM

## 2019-10-31 DIAGNOSIS — R590 Localized enlarged lymph nodes: Secondary | ICD-10-CM | POA: Diagnosis not present

## 2019-10-31 DIAGNOSIS — R202 Paresthesia of skin: Secondary | ICD-10-CM | POA: Diagnosis not present

## 2019-10-31 DIAGNOSIS — L709 Acne, unspecified: Secondary | ICD-10-CM

## 2019-10-31 NOTE — Progress Notes (Signed)
Name: [name]   MRN: [id number]    DOB: [birth year]   Date:10/31/2019       Progress Note  Subjective  Chief Complaint  Chief Complaint  Patient presents with  . Numbness    In hands sometimes after waking up. Numbness comes and goes when she is sleeping x 2 weeks.  . Ear Problem    She continues to have problems with the knot behind her left ear. She has been evaluated by ENT. She thinks she may need to follow up with them. Issues not yet resolved.    HPI  Interpreter present during visit   Post-auricular nodule: Seen by ENT, Dr. Pryor Ochoa in April and at the time the lump was much smaller but no  pain, he told her to call back if recurrence, over the past week she noticed recurrence of lump, advised her to contact him again. She has his number. Denies headache, chills, rhinorrhea, congestion, sore throat, fever.   Numbness: she noticed numbness on hands, not sure on the location exactly but thinks in the middle but also 5th finger and possibly thumb. 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 She states it has been happening about 5 times a week when she first gets up and resolves by itself. No weakness, symptoms are mild. Discussed carpal tunnel, advised to wear a brace and we will give her information about carpal tunnel syndrome   Adult acne: seeing dermatologist and is doing well on current regiment   Anxiety: she stopped taking buspar and is doing better now   Patient Active Problem List   Diagnosis Date Noted  . S/P cesarean section 01/13/2013  . Bilateral deafness 01/12/2013    Past Surgical History:  Procedure Laterality Date  . CESAREAN SECTION    . CESAREAN SECTION N/A 01/13/2013   Procedure: REPEAT CESAREAN SECTION;  Surgeon: Thornell Sartorius, MD;  Location: [facility];  Service: Obstetrics;  Laterality: N/A;  Jamie from office called and states pt is deaf and needs interpreter on day of surgery.  EDA Royal called and intrepreter scheduled for day of surgery.  .  CESAREAN SECTION N/A 06/28/2015   Procedure: CESAREAN SECTION;  Surgeon: Janyth Contes, MD;  Location: [facility];  Service: Obstetrics;  Laterality: N/A;  . INTRAUTERINE DEVICE (IUD) INSERTION  02/18/2019  . WISDOM TOOTH EXTRACTION      Family History  Problem Relation Age of Onset  . Breast cancer Mother        pt doesnt remember  . Diabetes Father   . Hypertension Brother   . Breast cancer Paternal Grandmother   . Breast cancer Maternal Aunt   . Heart attack Paternal Aunt   . Prostate cancer Paternal Uncle     Social History   Tobacco Use  . Smoking status: Never Smoker  . Smokeless tobacco: Never Used  Substance Use Topics  . Alcohol use: Not Currently    Alcohol/week: 1.0 standard drinks    Types: 1 Glasses of wine per week     Current Outpatient Medications:  .  levonorgestrel (MIRENA) 20 MCG/24HR IUD, 1 each by Intrauterine route once., Disp: , Rfl:  .  tretinoin (RETIN-A) 0.05 % cream, Apply topically at bedtime., Disp: 45 g, Rfl: 0 .  Dapsone (ACZONE) 7.5 % GEL, Apply 1 application topically in the morning. (Patient not taking: Reported on 10/31/2019), Disp: 60 g, Rfl: 3  Allergies  Allergen Reactions  . Lexapro [Escitalopram Oxalate] Nausea Only and Hypertension  . Nickel Rash  I personally reviewed active problem list, medication list, allergies, family history, social history, health maintenance with the patient/caregiver today.   ROS  Ten systems reviewed and is negative except as mentioned in HPI   Objective  Vitals:   10/31/19 1112  BP: 104/76  Pulse: 71  Resp: 16  Temp: (!) 97.3 F (36.3 C)  TempSrc: Temporal  SpO2: 98%  Weight: 152 lb 9.6 oz (69.2 kg)  Height: 5\' 4"  (1.626 m)    Body mass index is 26.19 kg/m.  Physical Exam  Constitutional: Patient appears well-developed and well-nourished. Obese  No distress.  HEENT: head atraumatic, normocephalic, pupils equal and reactive to light, ears 2x3 cm non tender lump behind left ear,  no rashes, no increase in warmth, neck supple, throat within normal limits Cardiovascular: Normal rate, regular rhythm and normal heart sounds.  No murmur heard. No BLE edema. Pulmonary/Chest: Effort normal and breath sounds normal. No respiratory distress. Abdominal: Soft.  There is no tenderness. Muscular skeletal: normal drip, negative tinnel an Phalen's test, normal neck exam  Psychiatric: Patient has a normal mood and affect. behavior is normal. Judgment and thought content normal.  PHQ2/9: Depression screen [facility] 2/9 10/31/2019 07/23/2019 04/11/2019 03/11/2019 03/11/2019  Decreased Interest 0 3 0 3 3  Down, Depressed, Hopeless 0 1 0 3 3  PHQ - 2 Score 0 4 0 6 6  Altered sleeping 0 0 1 2 2   Tired, decreased energy 0 1 0 3 3  Change in appetite 0 0 0 3 3  Feeling bad or failure about yourself  0 0 0 2 2  Trouble concentrating 1 0 0 1 1  Moving slowly or fidgety/restless 0 0 0 0 0  Suicidal thoughts 0 0 0 0 0  PHQ-9 Score 1 5 1 17 17   Difficult doing work/chores Not difficult at all Not difficult at all Not difficult at all - Very difficult  Some recent data might be hidden    phq 9 is negative   Fall Risk: Fall Risk  10/31/2019 07/23/2019 04/11/2019 03/11/2019 03/05/2019  Falls in the past year? 0 0 0 0 0  Number falls in past yr: 0 0 0 0 0  Injury with Fall? 0 0 0 0 0  Follow up - - - - -      Functional Status Survey: Is the patient deaf or have difficulty hearing?: Yes Does the patient have difficulty seeing, even when wearing glasses/contacts?: No Does the patient have difficulty concentrating, remembering, or making decisions?: Yes Does the patient have difficulty walking or climbing stairs?: No Does the patient have difficulty dressing or bathing?: No Does the patient have difficulty doing errands alone such as visiting a doctor's office or shopping?: No    Assessment & Plan  1. Posterior auricular lymphadenopathy  She will contact ENT today   2. Paresthesia of both  hands  Discussed carpal tunnels discussed when to return for follow up, giving her written information about it   3. Anxiety  Able to control at home without medication  4. Adult acne  Doing well with current regiment

## 2019-10-31 NOTE — Patient Instructions (Signed)
Carpal Tunnel Syndrome  Carpal tunnel syndrome is a condition that causes pain in your hand and arm. The carpal tunnel is a narrow area located on the palm side of your wrist. Repeated wrist motion or certain diseases may cause swelling within the tunnel. This swelling pinches the main nerve in the wrist (median nerve). What are the causes? This condition may be caused by:  Repeated wrist motions.  Wrist injuries.  Arthritis.  A cyst or tumor in the carpal tunnel.  Fluid buildup during pregnancy. Sometimes the cause of this condition is not known. What increases the risk? The following factors may make you more likely to develop this condition:  Having a job, such as being a butcher or a cashier, that requires you to repeatedly move your wrist in the same motion.  Being a woman.  Having certain conditions, such as: ? Diabetes. ? Obesity. ? An underactive thyroid (hypothyroidism). ? Kidney failure. What are the signs or symptoms? Symptoms of this condition include:  A tingling feeling in your fingers, especially in your thumb, index, and middle fingers.  Tingling or numbness in your hand.  An aching feeling in your entire arm, especially when your wrist and elbow are bent for a long time.  Wrist pain that goes up your arm to your shoulder.  Pain that goes down into your palm or fingers.  A weak feeling in your hands. You may have trouble grabbing and holding items. Your symptoms may feel worse during the night. How is this diagnosed? This condition is diagnosed with a medical history and physical exam. You may also have tests, including:  Electromyogram (EMG). This test measures electrical signals sent by your nerves into the muscles.  Nerve conduction study. This test measures how well electrical signals pass through your nerves.  Imaging tests, such as X-rays, ultrasound, and MRI. These tests check for possible causes of your condition. How is this treated? This  condition may be treated with:  Lifestyle changes. It is important to stop or change the activity that caused your condition.  Doing exercise and activities to strengthen your muscles and bones (physical therapy).  Learning how to use your hand again after diagnosis (occupational therapy).  Medicines for pain and inflammation. This may include medicine that is injected into your wrist.  A wrist splint.  Surgery. Follow these instructions at home: If you have a splint:  Wear the splint as told by your health care provider. Remove it only as told by your health care provider.  Loosen the splint if your fingers tingle, become numb, or turn cold and blue.  Keep the splint clean.  If the splint is not waterproof: ? Do not let it get wet. ? Cover it with a watertight covering when you take a bath or shower. Managing pain, stiffness, and swelling   If directed, put ice on the painful area: ? If you have a removable splint, remove it as told by your health care provider. ? Put ice in a plastic bag. ? Place a towel between your skin and the bag. ? Leave the ice on for 20 minutes, 2-3 times per day. General instructions  Take over-the-counter and prescription medicines only as told by your health care provider.  Rest your wrist from any activity that may be causing your pain. If your condition is work related, talk with your employer about changes that can be made, such as getting a wrist pad to use while typing.  Do any exercises as told   by your health care provider, physical therapist, or occupational therapist.  Keep all follow-up visits as told by your health care provider. This is important. Contact a health care provider if:  You have new symptoms.  Your pain is not controlled with medicines.  Your symptoms get worse. Get help right away if:  You have severe numbness or tingling in your wrist or hand. Summary  Carpal tunnel syndrome is a condition that causes pain in  your hand and arm.  It is usually caused by repeated wrist motions.  Lifestyle changes and medicines are used to treat carpal tunnel syndrome. Surgery may be recommended.  Follow your health care provider's instructions about wearing a splint, resting from activity, keeping follow-up visits, and calling for help. This information is not intended to replace advice given to you by your health care provider. Make sure you discuss any questions you have with your health care provider. Document Revised: 10/12/2017 Document Reviewed: 10/12/2017 Elsevier Patient Education  2020 Elsevier Inc.  

## 2019-11-06 DIAGNOSIS — R599 Enlarged lymph nodes, unspecified: Secondary | ICD-10-CM | POA: Diagnosis not present

## 2019-11-06 DIAGNOSIS — H903 Sensorineural hearing loss, bilateral: Secondary | ICD-10-CM | POA: Diagnosis not present

## 2019-12-31 ENCOUNTER — Ambulatory Visit (INDEPENDENT_AMBULATORY_CARE_PROVIDER_SITE_OTHER): Payer: Medicare Other | Admitting: Dermatology

## 2019-12-31 ENCOUNTER — Other Ambulatory Visit: Payer: Self-pay

## 2019-12-31 DIAGNOSIS — L988 Other specified disorders of the skin and subcutaneous tissue: Secondary | ICD-10-CM | POA: Diagnosis not present

## 2019-12-31 DIAGNOSIS — L700 Acne vulgaris: Secondary | ICD-10-CM

## 2019-12-31 NOTE — Progress Notes (Signed)
   Follow-Up Visit   Subjective  [name] is a 39 y.o. female who presents for the following: Acne (face. She is doing well and treating with Aczone qam and Tretinoin qhs.) and Other (Crease across forehead....wants to know if there is a cream to improve the look of it.)  Today's visit with a sign language interpreter today.  The following portions of the chart were reviewed this encounter and updated as appropriate:  Tobacco  Allergies  Meds  Problems  Med Hx  Surg Hx  Fam Hx     Review of Systems:  No other skin or systemic complaints except as noted in HPI or Assessment and Plan.  Objective  Well appearing patient in no apparent distress; mood and affect are within normal limits.  A focused examination was performed including face. Relevant physical exam findings are noted in the Assessment and Plan.  Objective  Face: Couple small crusts and spotty macular hyperpigmentation.  Objective  Head - Anterior (Face): Rhytides and volume loss. Horizontal deep lines across forehead  Assessment & Plan    Acne vulgaris Face  She should try to avoid foods that she knows exacerbates her acne.  Continue Aczone gel qam, Tretinoin cream 0.05% qhs. May also spot treat with tretinoin qam if needed.  Dapsone (ACZONE) 7.5 % GEL - Face  Elastosis of skin Head - Anterior (Face)  Discussed Botox and filler.  Advised patient Belotero would be the best filler to fill crease of forehead.  Discussed 5-10 units of Botox to forehead to relax forehead muscle.  Return in about 6 months (around 07/02/2020).   I, Ashok Cordia, CMA, am acting as scribe for Sarina Ser, MD .  Documentation: I have reviewed the above documentation for accuracy and completeness, and I agree with the above.  Sarina Ser, MD

## 2020-01-04 ENCOUNTER — Encounter: Payer: Self-pay | Admitting: Dermatology

## 2020-03-09 ENCOUNTER — Telehealth: Payer: Self-pay

## 2020-03-09 ENCOUNTER — Other Ambulatory Visit: Payer: Self-pay | Admitting: Family Medicine

## 2020-03-09 DIAGNOSIS — R928 Other abnormal and inconclusive findings on diagnostic imaging of breast: Secondary | ICD-10-CM

## 2020-03-09 DIAGNOSIS — N6489 Other specified disorders of breast: Secondary | ICD-10-CM

## 2020-03-09 NOTE — Telephone Encounter (Signed)
Copied from [place] [id number]. Topic: Referral - Request for Referral >> Mar 09, 2020  4:21 PM Alanda Slim E wrote: Has patient seen PCP for this complaint? Yes  *If NO, is insurance requiring patient see PCP for this issue before PCP can refer them? Referral for which specialty: Mammography  Preferred provider/office: [facility]  Reason for referral: Pt needs referral for follow up appt

## 2020-03-26 ENCOUNTER — Other Ambulatory Visit: Payer: Self-pay

## 2020-03-26 ENCOUNTER — Ambulatory Visit
Admission: RE | Admit: 2020-03-26 | Discharge: 2020-03-26 | Disposition: A | Discharge status: Home / Self Care | Payer: Medicare Other | Source: Ambulatory Visit | Attending: Family Medicine | Admitting: Family Medicine

## 2020-03-26 DIAGNOSIS — N6489 Other specified disorders of breast: Secondary | ICD-10-CM | POA: Diagnosis not present

## 2020-03-26 DIAGNOSIS — R928 Other abnormal and inconclusive findings on diagnostic imaging of breast: Secondary | ICD-10-CM | POA: Diagnosis not present

## 2020-04-27 ENCOUNTER — Ambulatory Visit: Payer: Medicare Other | Admitting: Family Medicine

## 2020-05-03 NOTE — Progress Notes (Deleted)
Name: [name]   MRN: [id number]    DOB: [birth year]   Date:05/03/2020       Progress Note  Subjective  Chief Complaint  6 month follow up   HPI Interpreter present during visit   Post-auricular nodule: Seen by ENT, Dr. Pryor Ochoa in April and at the time the lump was much smaller but no  pain, he told her to call back if recurrence, over the past week she noticed recurrence of lump, advised her to contact him again. She has his number. Denies headache, chills, rhinorrhea, congestion, sore throat, fever.   Numbness: she noticed numbness on hands, not sure on the location exactly but thinks in the middle but also 5th finger and possibly thumb. 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 She states it has been happening about 5 times a week when she first gets up and resolves by itself. No weakness, symptoms are mild. Discussed carpal tunnel, advised to wear a brace and we will give her information about carpal tunnel syndrome   Adult acne: seeing dermatologist and is doing well on current regiment   Anxiety: she stopped taking buspar and is doing better now  *** Patient Active Problem List   Diagnosis Date Noted  . S/P cesarean section 01/13/2013  . Bilateral deafness 01/12/2013    Past Surgical History:  Procedure Laterality Date  . CESAREAN SECTION    . CESAREAN SECTION N/A 01/13/2013   Procedure: REPEAT CESAREAN SECTION;  Surgeon: Thornell Sartorius, MD;  Location: [facility];  Service: Obstetrics;  Laterality: N/A;  Jamie from office called and states pt is deaf and needs interpreter on day of surgery.  EDA Royal called and intrepreter scheduled for day of surgery.  . CESAREAN SECTION N/A 06/28/2015   Procedure: CESAREAN SECTION;  Surgeon: Janyth Contes, MD;  Location: [facility];  Service: Obstetrics;  Laterality: N/A;  . INTRAUTERINE DEVICE (IUD) INSERTION  02/18/2019  . WISDOM TOOTH EXTRACTION      Family History  Problem Relation Age of Onset  . Breast cancer Mother        pt  doesnt remember  . Diabetes Father   . Hypertension Brother   . Breast cancer Paternal Grandmother   . Breast cancer Maternal Aunt   . Heart attack Paternal Aunt   . Prostate cancer Paternal Uncle     Social History   Tobacco Use  . Smoking status: Never Smoker  . Smokeless tobacco: Never Used  Substance Use Topics  . Alcohol use: Not Currently    Alcohol/week: 1.0 standard drink    Types: 1 Glasses of wine per week     Current Outpatient Medications:  .  Dapsone (ACZONE) 7.5 % GEL, Apply 1 application topically in the morning. (Patient not taking: Reported on 10/31/2019), Disp: 60 g, Rfl: 3 .  levonorgestrel (MIRENA) 20 MCG/24HR IUD, 1 each by Intrauterine route once., Disp: , Rfl:  .  tretinoin (RETIN-A) 0.05 % cream, Apply topically at bedtime., Disp: 45 g, Rfl: 0  Allergies  Allergen Reactions  . Lexapro [Escitalopram Oxalate] Nausea Only and Hypertension  . Nickel Rash    I personally reviewed {Reviewed:14835} with the patient/caregiver today.   ROS  ***  Objective  There were no vitals filed for this visit.  There is no height or weight on file to calculate BMI.  Physical Exam ***  No results found for this or any previous visit (from the past 2160 hour(s)).  Diabetic Foot Exam: Diabetic Foot Exam -  Simple   No data filed     ***  PHQ2/9: Depression screen [facility] 2/9 10/31/2019 07/23/2019 04/11/2019 03/11/2019 03/11/2019  Decreased Interest 0 3 0 3 3  Down, Depressed, Hopeless 0 1 0 3 3  PHQ - 2 Score 0 4 0 6 6  Altered sleeping 0 0 1 2 2   Tired, decreased energy 0 1 0 3 3  Change in appetite 0 0 0 3 3  Feeling bad or failure about yourself  0 0 0 2 2  Trouble concentrating 1 0 0 1 1  Moving slowly or fidgety/restless 0 0 0 0 0  Suicidal thoughts 0 0 0 0 0  PHQ-9 Score 1 5 1 17 17   Difficult doing work/chores Not difficult at all Not difficult at all Not difficult at all - Very difficult  Some recent data might be hidden    phq 9 is {gen pos  [id number]} ***  Fall Risk: Fall Risk  10/31/2019 07/23/2019 04/11/2019 03/11/2019 03/05/2019  Falls in the past year? 0 0 0 0 0  Number falls in past yr: 0 0 0 0 0  Injury with Fall? 0 0 0 0 0  Follow up - - - - -   ***   Functional Status Survey:   ***   Assessment & Plan  *** There are no diagnoses linked to this encounter.

## 2020-05-04 ENCOUNTER — Ambulatory Visit: Payer: Medicare Other | Admitting: Family Medicine

## 2020-05-05 ENCOUNTER — Ambulatory Visit: Payer: Medicare Other | Admitting: Internal Medicine

## 2020-05-18 ENCOUNTER — Ambulatory Visit: Payer: Medicare Other | Admitting: Family Medicine

## 2020-05-18 ENCOUNTER — Encounter: Payer: Self-pay | Admitting: Family Medicine

## 2020-06-14 ENCOUNTER — Ambulatory Visit: Payer: Medicare Other | Admitting: Family Medicine

## 2020-07-12 ENCOUNTER — Ambulatory Visit: Payer: Medicare Other | Admitting: Dermatology

## 2020-07-12 NOTE — Progress Notes (Signed)
Name: [name]   MRN: [id number]    DOB: [birth year]   Date:07/13/2020       Progress Note  Subjective  Chief Complaint  Follow up   HPI   Interpreter present during visit also her mother   Post-auricular nodule: Seen by ENT, Dr. Pryor Ochoa in April and at the time the lump was sore , always on left side but  no longer having pain and not worried about it.   Numbness: she was sleeping on her right shoulder, resolved now.    Adult acne: seeing dermatologist and is doing well on current regiment . Unchanged and still has medication at home   Anxiety/Depression :she has 3 sons, 5-7 and 12 and all three going to school, taking time for herself, not taking medication and is doing well without it. She is going for walks, stays active. She was overwhelmed and depressed in the beginning of the pandemic but doing well now    Hearing loss: secondary to meningitis at age 40 yo, she is trying to get a job, on disability   Patient Active Problem List   Diagnosis Date Noted  . S/P cesarean section 01/13/2013  . Bilateral deafness 01/12/2013    Past Surgical History:  Procedure Laterality Date  . CESAREAN SECTION    . CESAREAN SECTION N/A 01/13/2013   Procedure: REPEAT CESAREAN SECTION;  Surgeon: Thornell Sartorius, MD;  Location: [facility];  Service: Obstetrics;  Laterality: N/A;  Jamie from office called and states pt is deaf and needs interpreter on day of surgery.  EDA Royal called and intrepreter scheduled for day of surgery.  . CESAREAN SECTION N/A 06/28/2015   Procedure: CESAREAN SECTION;  Surgeon: Janyth Contes, MD;  Location: [facility];  Service: Obstetrics;  Laterality: N/A;  . INTRAUTERINE DEVICE (IUD) INSERTION  02/18/2019  . WISDOM TOOTH EXTRACTION      Family History  Problem Relation Age of Onset  . Breast cancer Mother        pt doesnt remember  . Diabetes Father   . Hypertension Brother   . Breast cancer Paternal Grandmother   . Breast cancer Maternal Aunt   . Heart attack  Paternal Aunt   . Prostate cancer Paternal Uncle     Social History   Tobacco Use  . Smoking status: Never Smoker  . Smokeless tobacco: Never Used  Substance Use Topics  . Alcohol use: Not Currently    Alcohol/week: 1.0 standard drink    Types: 1 Glasses of wine per week     Current Outpatient Medications:  .  Dapsone (ACZONE) 7.5 % GEL, Apply 1 application topically in the morning., Disp: 60 g, Rfl: 3 .  levonorgestrel (MIRENA) 20 MCG/24HR IUD, 1 each by Intrauterine route once., Disp: , Rfl:  .  tretinoin (RETIN-A) 0.05 % cream, Apply topically at bedtime., Disp: 45 g, Rfl: 0  Allergies  Allergen Reactions  . Lexapro [Escitalopram Oxalate] Nausea Only and Hypertension  . Nickel Rash    I personally reviewed active problem list, medication list, allergies, family history, social history, health maintenance with the patient/caregiver today.   ROS  Ten systems reviewed and is negative except as mentioned in HPI   Objective  Vitals:   07/13/20 1036  BP: 116/80  Pulse: 68  Resp: 16  Temp: 99.1 F (37.3 C)  TempSrc: Oral  SpO2: 99%  Weight: 162 lb 14.4 oz (73.9 kg)  Height: 5\' 4"  (1.626 m)    Body mass index is 27.96 kg/m.  Physical Exam  Constitutional: Patient appears well-developed and well-nourished. Overweight.  No distress.  HEENT: head atraumatic, normocephalic, pupils equal and reactive to light, ears : still has posterior auricular nodule, non tender ,  neck supple, throat within normal limits Cardiovascular: Normal rate, regular rhythm and normal heart sounds.  No murmur heard. No BLE edema. Pulmonary/Chest: Effort normal and breath sounds normal. No respiratory distress. Abdominal: Soft.  There is no tenderness. Psychiatric: Patient has a normal mood and affect. behavior is normal. Judgment and thought content normal.  PHQ2/9: Depression screen [facility] 2/9 07/13/2020 07/13/2020 10/31/2019 07/23/2019 04/11/2019  Decreased Interest 1 1 0 3 0  Down,  Depressed, Hopeless 0 0 0 1 0  PHQ - 2 Score 1 1 0 4 0  Altered sleeping 0 - 0 0 1  Tired, decreased energy 1 - 0 1 0  Change in appetite 0 - 0 0 0  Feeling bad or failure about yourself  0 - 0 0 0  Trouble concentrating 0 - 1 0 0  Moving slowly or fidgety/restless 0 - 0 0 0  Suicidal thoughts 0 - 0 0 0  PHQ-9 Score 2 - 1 5 1   Difficult doing work/chores Somewhat difficult - Not difficult at all Not difficult at all Not difficult at all  Some recent data might be hidden    phq 9 is positive  GAD 7 : Generalized Anxiety Score 07/13/2020 07/23/2019 04/11/2019 03/11/2019  Nervous, Anxious, on Edge 0 0 1 3  Control/stop worrying 0 0 0 3  Worry too much - different things 0 0 0 1  Trouble relaxing 0 0 0 1  Restless 0 0 0 1  Easily annoyed or irritable 2 1 1 2   Afraid - awful might happen 0 0 0 -  Total GAD 7 Score 2 1 2  -  Anxiety Difficulty Somewhat difficult Not difficult at all Not difficult at all -    Fall Risk: Fall Risk  07/13/2020 10/31/2019 07/23/2019 04/11/2019 03/11/2019  Falls in the past year? 0 0 0 0 0  Number falls in past yr: 0 0 0 0 0  Injury with Fall? 0 0 0 0 0  Follow up - - - - -    ional Status Survey: Is the patient deaf or have difficulty hearing?: Yes (Deaf) Does the patient have difficulty seeing, even when wearing glasses/contacts?: No Does the patient have difficulty concentrating, remembering, or making decisions?: No Does the patient have difficulty walking or climbing stairs?: No Does the patient have difficulty dressing or bathing?: No Does the patient have difficulty doing errands alone such as visiting a doctor's office or shopping?: No    Assessment & Plan  1. Depression with anxiety  Doing well at this time  2. Adult acne  Doing well on medication  3. Posterior auricular lymphadenopathy  Doing well now  4. Need for hepatitis C screening test  - Hepatitis C Antibody - next visit   5. Overweight (BMI 25.0-29.9)  Discussed with the  patient the risk posed by an increased BMI. Discussed importance of portion control, calorie counting and at least 150 minutes of physical activity weekly. Avoid sweet beverages and drink more water. Eat at least 6 servings of fruit and vegetables daily   Discussed with the patient the risk posed by an increased BMI. Discussed importance of portion control, calorie counting and at least 150 minutes of physical activity weekly. Avoid sweet beverages and drink more water. Eat at least 6 servings of fruit  and vegetables daily

## 2020-07-13 ENCOUNTER — Ambulatory Visit (INDEPENDENT_AMBULATORY_CARE_PROVIDER_SITE_OTHER): Payer: Medicare Other | Admitting: Family Medicine

## 2020-07-13 ENCOUNTER — Encounter: Payer: Self-pay | Admitting: Family Medicine

## 2020-07-13 ENCOUNTER — Other Ambulatory Visit: Payer: Self-pay

## 2020-07-13 VITALS — BP 116/80 | HR 68 | Temp 99.1°F | Resp 16 | Ht 64.0 in | Wt 162.9 lb

## 2020-07-13 DIAGNOSIS — E663 Overweight: Secondary | ICD-10-CM

## 2020-07-13 DIAGNOSIS — R590 Localized enlarged lymph nodes: Secondary | ICD-10-CM

## 2020-07-13 DIAGNOSIS — L709 Acne, unspecified: Secondary | ICD-10-CM

## 2020-07-13 DIAGNOSIS — Z1159 Encounter for screening for other viral diseases: Secondary | ICD-10-CM | POA: Diagnosis not present

## 2020-07-13 DIAGNOSIS — F418 Other specified anxiety disorders: Secondary | ICD-10-CM

## 2020-08-04 ENCOUNTER — Telehealth: Payer: Self-pay | Admitting: Family Medicine

## 2020-08-04 NOTE — Telephone Encounter (Signed)
Copied from [place] [phone] [phone]. Topic: Medicare AWV >> Aug 04, 2020 10:45 AM Cher Nakai R wrote: Reason for CRM:  Left message for patient to call back and schedule Medicare Annual Wellness Visit (AWV) in office.   If unable to come into the office for AWV,  please offer to do virtually or by telephone.  Last AWV: 09/05/2018  Please schedule at anytime with [place].  40 minute appointment  Any questions, please contact me at [phone]

## 2020-10-05 ENCOUNTER — Ambulatory Visit: Payer: Medicare Other | Admitting: Dermatology

## 2020-10-13 ENCOUNTER — Other Ambulatory Visit: Payer: Self-pay

## 2020-10-13 ENCOUNTER — Ambulatory Visit (INDEPENDENT_AMBULATORY_CARE_PROVIDER_SITE_OTHER): Payer: Medicare Other | Admitting: Dermatology

## 2020-10-13 DIAGNOSIS — L700 Acne vulgaris: Secondary | ICD-10-CM

## 2020-10-13 MED ORDER — TRETINOIN 0.025 % EX CREA: TOPICAL_CREAM | Freq: Every day | CUTANEOUS | 6 refills | Status: DC

## 2020-10-13 MED ORDER — CLINDAMYCIN PHOSPHATE 1 % EX LOTN: TOPICAL_LOTION | CUTANEOUS | 6 refills | Status: DC

## 2020-10-13 NOTE — Patient Instructions (Addendum)

## 2020-10-13 NOTE — Progress Notes (Signed)
   Follow-Up Visit   Subjective  [name] is a 40 y.o. female who presents for the following: Acne (Check acne on the face, treating with Tretinoin .05% cream with fair response, Aczone too drying ).  Interpreter with patient Pt mom with patient   The following portions of the chart were reviewed this encounter and updated as appropriate:   Tobacco  Allergies  Meds  Problems  Med Hx  Surg Hx  Fam Hx     Review of Systems:  No other skin or systemic complaints except as noted in HPI or Assessment and Plan.  Objective  Well appearing patient in no apparent distress; mood and affect are within normal limits.  A focused examination was performed including face. Relevant physical exam findings are noted in the Assessment and Plan.  Objective  face: Papule on the Right cheek, few comedones    Assessment & Plan  Acne vulgaris -chronic and persistent.  Not to goal.  Side effects with some previous medications. face Start Retin A .025% cream apply to face at bedtime  Start Clindamycin lotion apply to face qam   Topical retinoid medications like tretinoin/Retin-A, adapalene/Differin, tazarotene/Fabior, and Epiduo/Epiduo Forte can cause dryness and irritation when first started. Only apply a pea-sized amount to the entire affected area. Avoid applying it around the eyes, edges of mouth and creases at the nose. If you experience irritation, use a good moisturizer first and/or apply the medicine less often. If you are doing well with the medicine, you can increase how often you use it until you are applying every night. Be careful with sun protection while using this medication as it can make you sensitive to the sun. This medicine should not be used by pregnant women.    Ordered Medications: tretinoin (RETIN-A) 0.025 % cream clindamycin (CLEOCIN-T) 1 % lotion  Return in about 4 months (around 02/12/2021) for Acne .  IMarye Round, CMA, am acting as scribe for Sarina Ser,  MD .  Documentation: I have reviewed the above documentation for accuracy and completeness, and I agree with the above.  Sarina Ser, MD

## 2020-10-14 ENCOUNTER — Encounter: Payer: Self-pay | Admitting: Dermatology

## 2021-01-12 NOTE — Progress Notes (Addendum)
Name: [name]   MRN: [id number]    DOB: [birth year]   Date:01/13/2021       Progress Note  Subjective  Chief Complaint  Annual Exam  HPI  Patient presents for annual wellness  She came in with sign language specialist from [employer] Name: Henrene Hawking  Diet: likes junk food, but eats a lot of salads, fruit and vegetables.  Exercise: discussed importance of regular activity 150 minutes per week.    [place] Office Visit from 07/13/2020 in [facility]  AUDIT-C Score 0      Depression: Phq 9 is  positive Depression screen [facility] 2/9 01/13/2021 07/13/2020 07/13/2020 10/31/2019 07/23/2019  Decreased Interest 0 1 1 0 3  Down, Depressed, Hopeless 0 0 0 0 1  PHQ - 2 Score 0 1 1 0 4  Altered sleeping 1 0 - 0 0  Tired, decreased energy 0 1 - 0 1  Change in appetite 0 0 - 0 0  Feeling bad or failure about yourself  0 0 - 0 0  Trouble concentrating 0 0 - 1 0  Moving slowly or fidgety/restless 0 0 - 0 0  Suicidal thoughts 0 0 - 0 0  PHQ-9 Score 1 2 - 1 5  Difficult doing work/chores - Somewhat difficult - Not difficult at all Not difficult at all  Some recent data might be hidden   Hypertension: BP Readings from Last 3 Encounters:  01/13/21 116/80  07/13/20 116/80  10/31/19 104/76   Obesity: Wt Readings from Last 3 Encounters:  01/13/21 170 lb (77.1 kg)  07/13/20 162 lb 14.4 oz (73.9 kg)  10/31/19 152 lb 9.6 oz (69.2 kg)   BMI Readings from Last 3 Encounters:  01/13/21 29.18 kg/m  07/13/20 27.96 kg/m  10/31/19 26.19 kg/m     Vaccines:  HPV: up to at age 55 , ask insurance if age between 12-45 , second dose today  Pneumonia: educated and discussed with patient. Flu: educated and discussed with patient.  Hep C Screening: N/A STD testing and prevention (HIV/chl/gon/syphilis): 04/06/15 Intimate partner violence:negative Sexual History : no pain or discomfort  Menstrual History/LMP/Abnormal Bleeding: she has IUD  Incontinence Symptoms:  no problems   Breast cancer:  - Last Mammogram: yearly due to family history of breast cancer  - BRCA gene screening: mother had breast cancer   Osteoporosis: Discussed high calcium and vitamin D supplementation, weight bearing exercises  Cervical cancer screening: 03/05/19  Skin cancer: Discussed monitoring for atypical lesions  Lung cancer:  Low Dose CT Chest recommended if Age 73-80 years, 20 pack-year currently smoking OR have quit w/in 15years. Patient does not qualify.   ECG: 07/23/19  Advanced Care Planning: A voluntary discussion about advance care planning including the explanation and discussion of advance directives.  Discussed health care proxy and Living will, and the patient was able to identify a health care proxy as husband    Glucose: Glucose, Bld  Date Value Ref Range Status  07/23/2019 88 65 - 99 mg/dL Final    Comment:    .            Fasting reference interval .   03/12/2019 113 (H) 70 - 99 mg/dL Final  02/20/2019 101 (H) 70 - 99 mg/dL Final    Patient Active Problem List   Diagnosis Date Noted   S/P cesarean section 01/13/2013   Bilateral deafness 01/12/2013    Past Surgical History:  Procedure Laterality Date   CESAREAN SECTION  CESAREAN SECTION N/A 01/13/2013   Procedure: REPEAT CESAREAN SECTION;  Surgeon: Thornell Sartorius, MD;  Location: [facility];  Service: Obstetrics;  Laterality: N/A;  Jamie from office called and states pt is deaf and needs interpreter on day of surgery.  EDA Royal called and intrepreter scheduled for day of surgery.   CESAREAN SECTION N/A 06/28/2015   Procedure: CESAREAN SECTION;  Surgeon: Janyth Contes, MD;  Location: [facility];  Service: Obstetrics;  Laterality: N/A;   INTRAUTERINE DEVICE (IUD) INSERTION  02/18/2019   WISDOM TOOTH EXTRACTION      Family History  Problem Relation Age of Onset   Breast cancer Mother        pt doesnt remember   Diabetes Father    Hypertension Brother    Breast cancer Paternal Grandmother     Breast cancer Maternal Aunt    Heart attack Paternal Aunt    Prostate cancer Paternal Uncle     Social History   Socioeconomic History   Marital status: Married    Spouse name: Kaytlen Lightsey   Number of children: 3   Years of education: Not on file   Highest education level: Associate degree: academic program  Occupational History   Occupation: disabled     Comment: hearing  Tobacco Use   Smoking status: Never   Smokeless tobacco: Never  Vaping Use   Vaping Use: Never used  Substance and Sexual Activity   Alcohol use: Not Currently    Alcohol/week: 1.0 standard drink    Types: 1 Glasses of wine per week   Drug use: Never   Sexual activity: Yes    Partners: Male    Birth control/protection: I.U.D.  Other Topics Concern   Not on file  Social History Narrative   Married with 3 children, stay at home with her children   Social Determinants of Health   Financial Resource Strain: Low Risk    Difficulty of Paying Living Expenses: Not hard at all  Food Insecurity: No Food Insecurity   Worried About Charity fundraiser in the Last Year: Never true   Arboriculturist in the Last Year: Never true  Transportation Needs: No Transportation Needs   Lack of Transportation (Medical): No   Lack of Transportation (Non-Medical): No  Physical Activity: Inactive   Days of Exercise per Week: 0 days   Minutes of Exercise per Session: 0 min  Stress: No Stress Concern Present   Feeling of Stress : Only a little  Social Connections: Moderately Integrated   Frequency of Communication with Friends and Family: More than three times a week   Frequency of Social Gatherings with Friends and Family: Once a week   Attends Religious Services: Never   Marine scientist or Organizations: Yes   Attends Music therapist: More than 4 times per year   Marital Status: Married  Human resources officer Violence: Not At Risk   Fear of Current or Ex-Partner: No   Emotionally Abused: No    Physically Abused: No   Sexually Abused: No     Current Outpatient Medications:    levonorgestrel (MIRENA) 20 MCG/24HR IUD, 1 each by Intrauterine route once., Disp: , Rfl:   Allergies  Allergen Reactions   Lexapro [Escitalopram Oxalate] Nausea Only and Hypertension   Nickel Rash     ROS  Constitutional: Negative for fever or weight change.  Respiratory: Negative for cough and shortness of breath.   Cardiovascular: Negative for chest pain or palpitations.  Gastrointestinal: Negative  for abdominal pain, no bowel changes.  Musculoskeletal: Negative for gait problem or joint swelling.  Skin: Negative for rash.  Neurological: Negative for dizziness or headache.  No other specific complaints in a complete review of systems (except as listed in HPI above).   Objective  Vitals:   01/13/21 0907  BP: 116/80  Pulse: 78  Resp: 16  Temp: 98.7 F (37.1 C)  TempSrc: Oral  SpO2: 98%  Weight: 170 lb (77.1 kg)  Height: '5\' 4"'  (1.626 m)    Body mass index is 29.18 kg/m.  Physical Exam  Constitutional: Patient appears well-developed and well-nourished. No distress.  HENT: Head: Normocephalic and atraumatic. Ears: B TMs ok, no erythema or effusion; Nose: Nose normal. Mouth/Throat: not done  Eyes: Conjunctivae and EOM are normal. Pupils are equal, round, and reactive to light. No scleral icterus.  Neck: Normal range of motion. Neck supple. No JVD present. No thyromegaly present.  Cardiovascular: Normal rate, regular rhythm and normal heart sounds.  No murmur heard. No BLE edema. Pulmonary/Chest: Effort normal and breath sounds normal. No respiratory distress. Abdominal: Soft. Bowel sounds are normal, no distension. There is no tenderness. no masses Breast: no lumps or masses, no nipple discharge or rashes FEMALE GENITALIA:  Not done  RECTAL: not done  Musculoskeletal: Normal range of motion, no joint effusions. No gross deformities Neurological: he is alert and oriented to person,  place, and time. No cranial nerve deficit. Coordination, balance, strength, speech and gait are normal.  Skin: Skin is warm and dry. No rash noted. No erythema.  Psychiatric: Patient has a normal mood and affect. behavior is normal. Judgment and thought content normal.    Fall Risk: Fall Risk  01/13/2021 07/13/2020 10/31/2019 07/23/2019 04/11/2019  Falls in the past year? 0 0 0 0 0  Number falls in past yr: 0 0 0 0 0  Injury with Fall? 0 0 0 0 0  Follow up - - - - -    Functional Status Survey: Is the patient deaf or have difficulty hearing?: Yes Does the patient have difficulty seeing, even when wearing glasses/contacts?: No Does the patient have difficulty concentrating, remembering, or making decisions?: No Does the patient have difficulty walking or climbing stairs?: No Does the patient have difficulty dressing or bathing?: No Does the patient have difficulty doing errands alone such as visiting a doctor's office or shopping?: No   Assessment & Plan  1. Medicare well    2. Breast cancer screening by mammogram  - MM 3D SCREEN BREAST BILATERAL; Future  3. Need for hepatitis C screening test  - Hepatitis C antibody  4. Need for lipid screening  - Lipid panel  5. Need for HPV vaccination  - HPV 9-valent vaccine,Recombinat   Discussed COVID-19 booster   -USPSTF grade A and B recommendations reviewed with patient; age-appropriate recommendations, preventive care, screening tests, etc discussed and encouraged; healthy living encouraged; see AVS for patient education given to patient -Discussed importance of 150 minutes of physical activity weekly, eat two servings of fish weekly, eat one serving of tree nuts ( cashews, pistachios, pecans, almonds.Marland Kitchen) every other day, eat 6 servings of fruit/vegetables daily and drink plenty of water and avoid sweet beverages.

## 2021-01-12 NOTE — Patient Instructions (Signed)
Preventive Care 21-39 Years Old, Female Preventive care refers to lifestyle choices and visits with your health care provider that can promote health and wellness. This includes: A yearly physical exam. This is also called an annual wellness visit. Regular dental and eye exams. Immunizations. Screening for certain conditions. Healthy lifestyle choices, such as: Eating a healthy diet. Getting regular exercise. Not using drugs or products that contain nicotine and tobacco. Limiting alcohol use. What can I expect for my preventive care visit? Physical exam Your health care provider may check your: Height and weight. These may be used to calculate your BMI (body mass index). BMI is a measurement that tells if you are at a healthy weight. Heart rate and blood pressure. Body temperature. Skin for abnormal spots. Counseling Your health care provider may ask you questions about your: Past medical problems. Family's medical history. Alcohol, tobacco, and drug use. Emotional well-being. Home life and relationship well-being. Sexual activity. Diet, exercise, and sleep habits. Work and work environment. Access to firearms. Method of birth control. Menstrual cycle. Pregnancy history. What immunizations do I need?  Vaccines are usually given at various ages, according to a schedule. Your health care provider will recommend vaccines for you based on your age, medicalhistory, and lifestyle or other factors, such as travel or where you work. What tests do I need?  Blood tests Lipid and cholesterol levels. These may be checked every 5 years starting at age 20. Hepatitis C test. Hepatitis B test. Screening Diabetes screening. This is done by checking your blood sugar (glucose) after you have not eaten for a while (fasting). STD (sexually transmitted disease) testing, if you are at risk. BRCA-related cancer screening. This may be done if you have a family history of breast, ovarian, tubal, or  peritoneal cancers. Pelvic exam and Pap test. This may be done every 3 years starting at age 21. Starting at age 30, this may be done every 5 years if you have a Pap test in combination with an HPV test. Talk with your health care provider about your test results, treatment options,and if necessary, the need for more tests. Follow these instructions at home: Eating and drinking  Eat a healthy diet that includes fresh fruits and vegetables, whole grains, lean protein, and low-fat dairy products. Take vitamin and mineral supplements as recommended by your health care provider. Do not drink alcohol if: Your health care provider tells you not to drink. You are pregnant, may be pregnant, or are planning to become pregnant. If you drink alcohol: Limit how much you have to 0-1 drink a day. Be aware of how much alcohol is in your drink. In the [place] one drink equals one 12 oz bottle of beer (355 mL), one 5 oz glass of wine (148 mL), or one 1 oz glass of hard liquor (44 mL).  Lifestyle Take daily care of your teeth and gums. Brush your teeth every morning and night with fluoride toothpaste. Floss one time each day. Stay active. Exercise for at least 30 minutes 5 or more days each week. Do not use any products that contain nicotine or tobacco, such as cigarettes, e-cigarettes, and chewing tobacco. If you need help quitting, ask your health care provider. Do not use drugs. If you are sexually active, practice safe sex. Use a condom or other form of protection to prevent STIs (sexually transmitted infections). If you do not wish to become pregnant, use a form of birth control. If you plan to become pregnant, see your health care   provider for a prepregnancy visit. Find healthy ways to cope with stress, such as: Meditation, yoga, or listening to music. Journaling. Talking to a trusted person. Spending time with friends and family. Safety Always wear your seat belt while driving or riding in a  vehicle. Do not drive: If you have been drinking alcohol. Do not ride with someone who has been drinking. When you are tired or distracted. While texting. Wear a helmet and other protective equipment during sports activities. If you have firearms in your house, make sure you follow all gun safety procedures. Seek help if you have been physically or sexually abused. What's next? Go to your health care provider once a year for an annual wellness visit. Ask your health care provider how often you should have your eyes and teeth checked. Stay up to date on all vaccines. This information is not intended to replace advice given to you by your health care provider. Make sure you discuss any questions you have with your healthcare provider. Document Revised: 02/01/2020 Document Reviewed: 02/14/2018 Elsevier Patient Education  2022 [employer].

## 2021-01-13 ENCOUNTER — Encounter: Payer: Self-pay | Admitting: Family Medicine

## 2021-01-13 ENCOUNTER — Ambulatory Visit (INDEPENDENT_AMBULATORY_CARE_PROVIDER_SITE_OTHER): Payer: Medicare Other | Admitting: Family Medicine

## 2021-01-13 ENCOUNTER — Other Ambulatory Visit: Payer: Self-pay

## 2021-01-13 VITALS — BP 116/80 | HR 78 | Temp 98.7°F | Resp 16 | Ht 64.0 in | Wt 170.0 lb

## 2021-01-13 DIAGNOSIS — Z23 Encounter for immunization: Secondary | ICD-10-CM

## 2021-01-13 DIAGNOSIS — Z0000 Encounter for general adult medical examination without abnormal findings: Secondary | ICD-10-CM | POA: Diagnosis not present

## 2021-01-13 DIAGNOSIS — Z13220 Encounter for screening for lipoid disorders: Secondary | ICD-10-CM | POA: Diagnosis not present

## 2021-01-13 DIAGNOSIS — Z1231 Encounter for screening mammogram for malignant neoplasm of breast: Secondary | ICD-10-CM

## 2021-01-13 DIAGNOSIS — Z1159 Encounter for screening for other viral diseases: Secondary | ICD-10-CM

## 2021-01-14 LAB — HEPATITIS C ANTIBODY
Hepatitis C Ab: NONREACTIVE
SIGNAL TO CUT-OFF: 0.03 (ref <1.00)

## 2021-01-14 LAB — LIPID PANEL
Cholesterol: 133 mg/dL (ref <200)
HDL: 37 mg/dL — ABNORMAL LOW (ref >=50)
LDL Cholesterol (Calc): 82 mg/dL (calc)
Non-HDL Cholesterol (Calc): 96 mg/dL (calc) (ref <130)
Total CHOL/HDL Ratio: 3.6 (calc) (ref <5.0)
Triglycerides: 55 mg/dL (ref <150)

## 2021-01-14 NOTE — Addendum Note (Signed)
Addended by: Steele Sizer F on: 01/14/2021 01:07 PM   Modules accepted: Level of Service

## 2021-02-15 ENCOUNTER — Ambulatory Visit: Payer: Medicare Other | Admitting: Dermatology

## 2021-04-20 ENCOUNTER — Other Ambulatory Visit: Payer: Self-pay

## 2021-04-20 ENCOUNTER — Ambulatory Visit
Admission: RE | Admit: 2021-04-20 | Discharge: 2021-04-20 | Disposition: A | Discharge status: Home / Self Care | Payer: Medicare Other | Source: Ambulatory Visit | Attending: Family Medicine | Admitting: Family Medicine

## 2021-04-20 DIAGNOSIS — Z1231 Encounter for screening mammogram for malignant neoplasm of breast: Secondary | ICD-10-CM | POA: Insufficient documentation

## 2021-07-15 ENCOUNTER — Ambulatory Visit: Payer: Medicare Other | Admitting: Family Medicine

## 2021-07-19 IMAGING — MG MM DIGITAL SCREENING BILAT W/ TOMO W/ CAD
6 of 10 series · 6 of 30 positions shown · non-contrast
Comparison: None.

CLINICAL DATA: Screening.

EXAM:
DIGITAL SCREENING BILATERAL MAMMOGRAM WITH TOMO AND CAD

[R MLO synth-2D (1 of 2)]
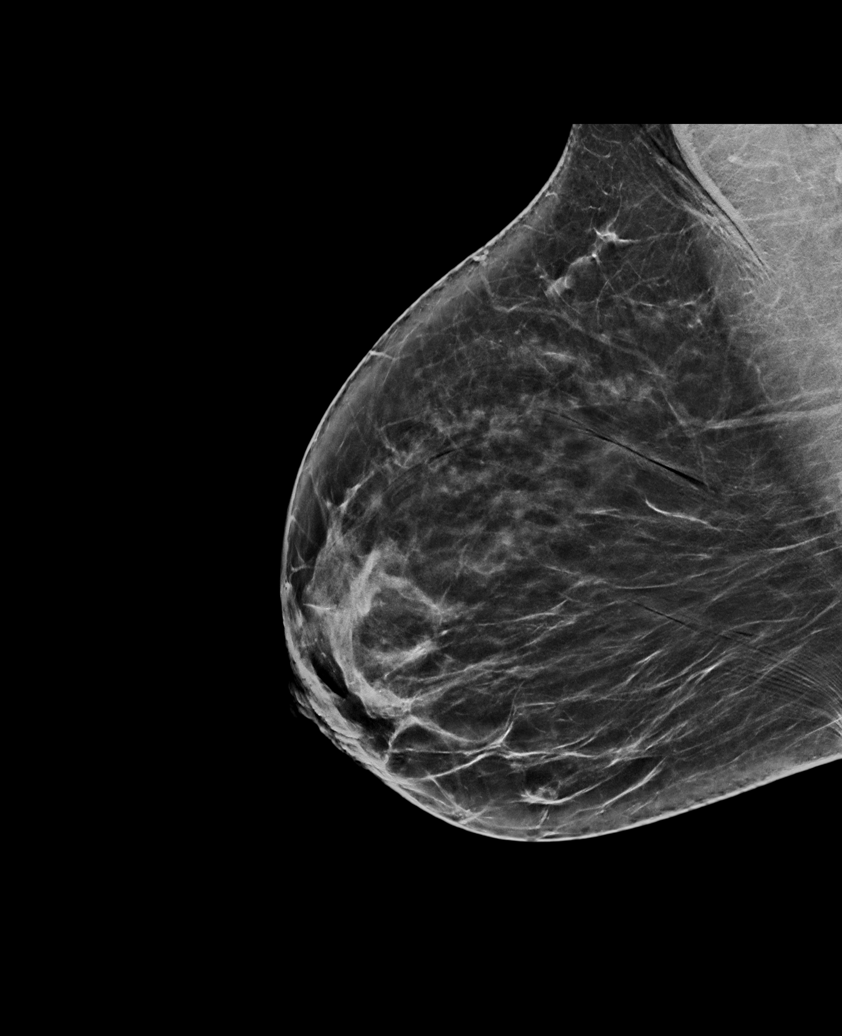

[L CC synth-2D]
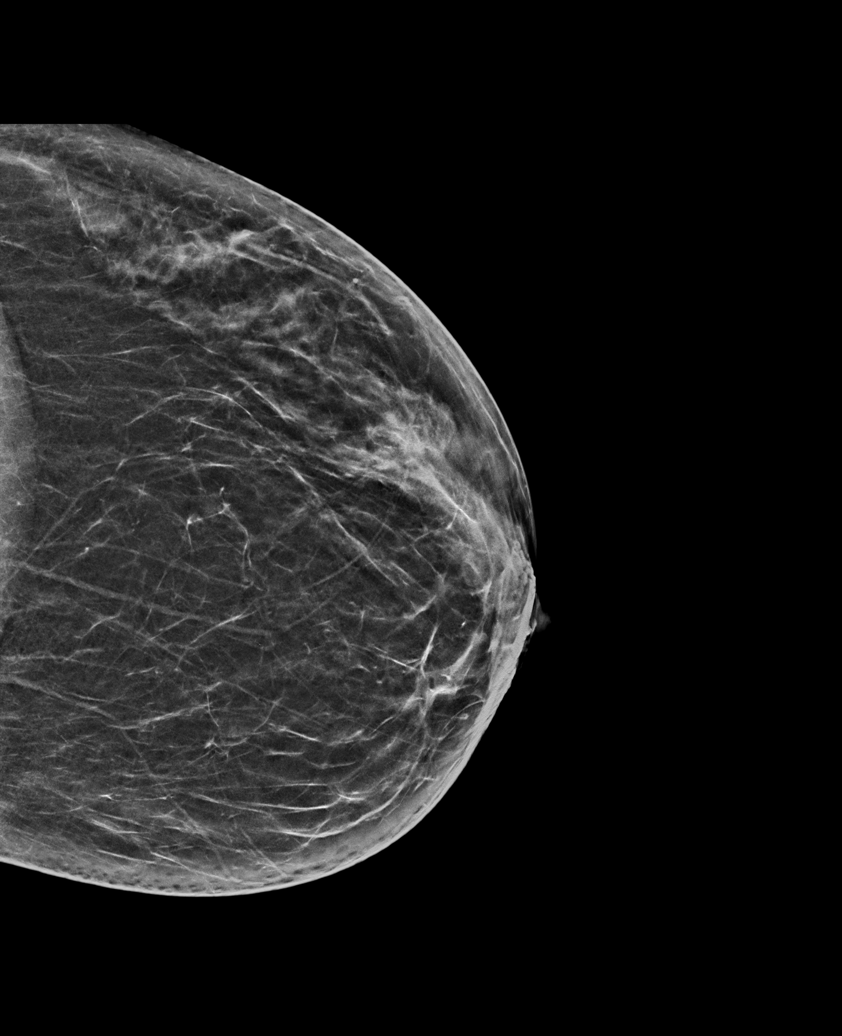

[R MLO synth-2D (2 of 2)]
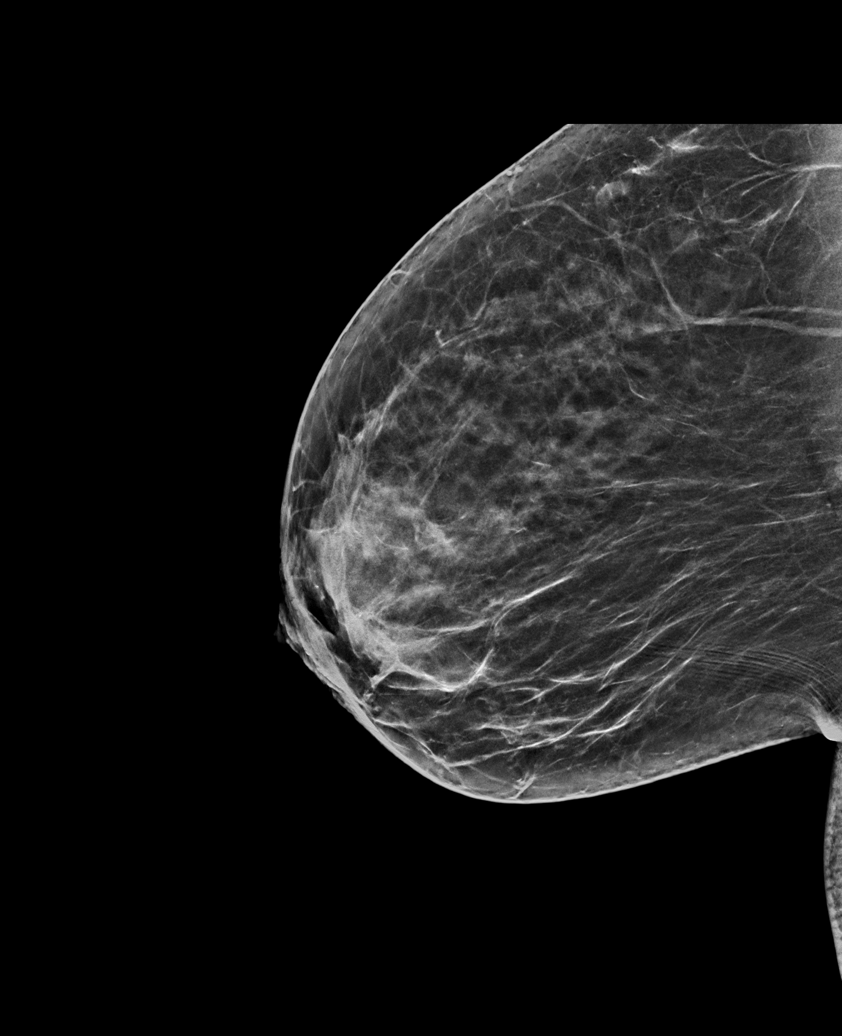

[R CC synth-2D]
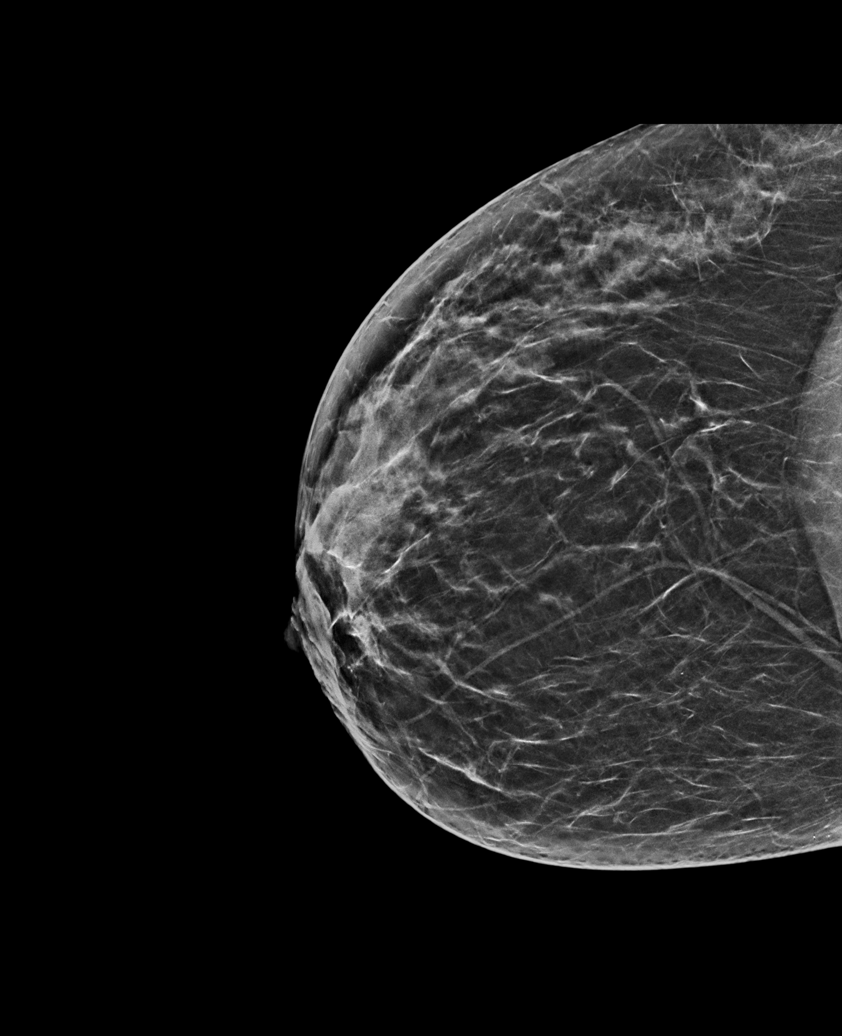

[L MLO synth-2D]
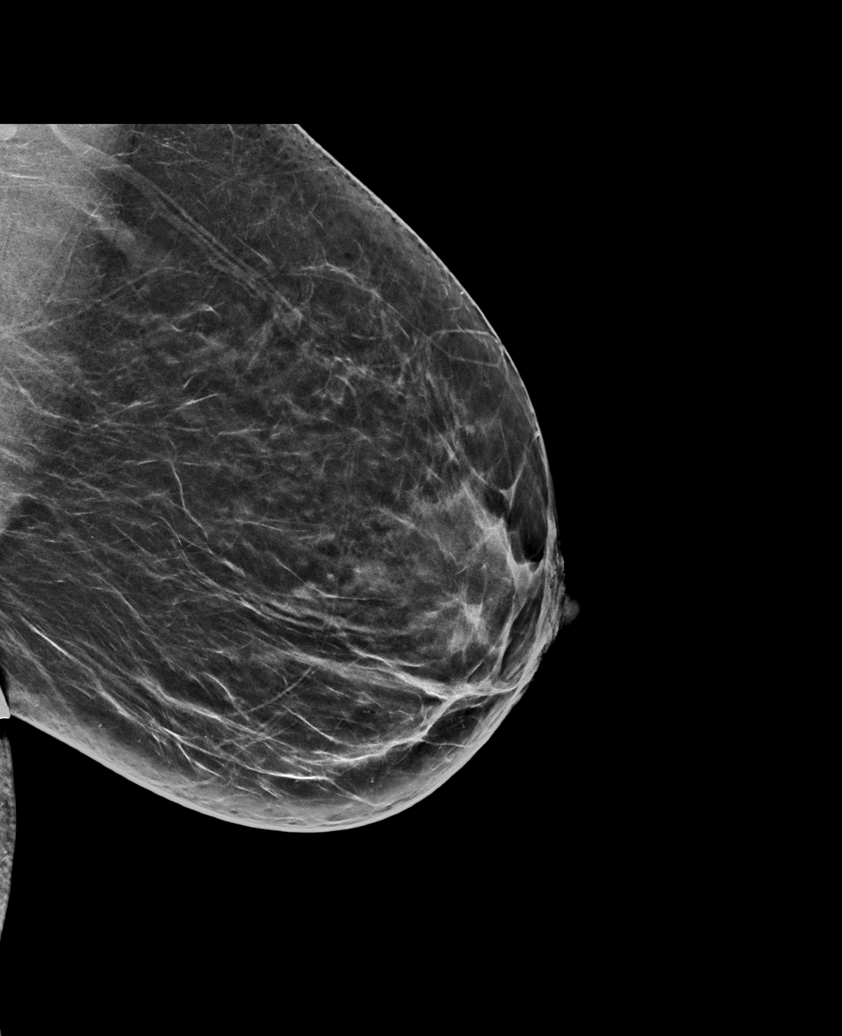

[L MLO tomo · tomo slice 33/64.0]
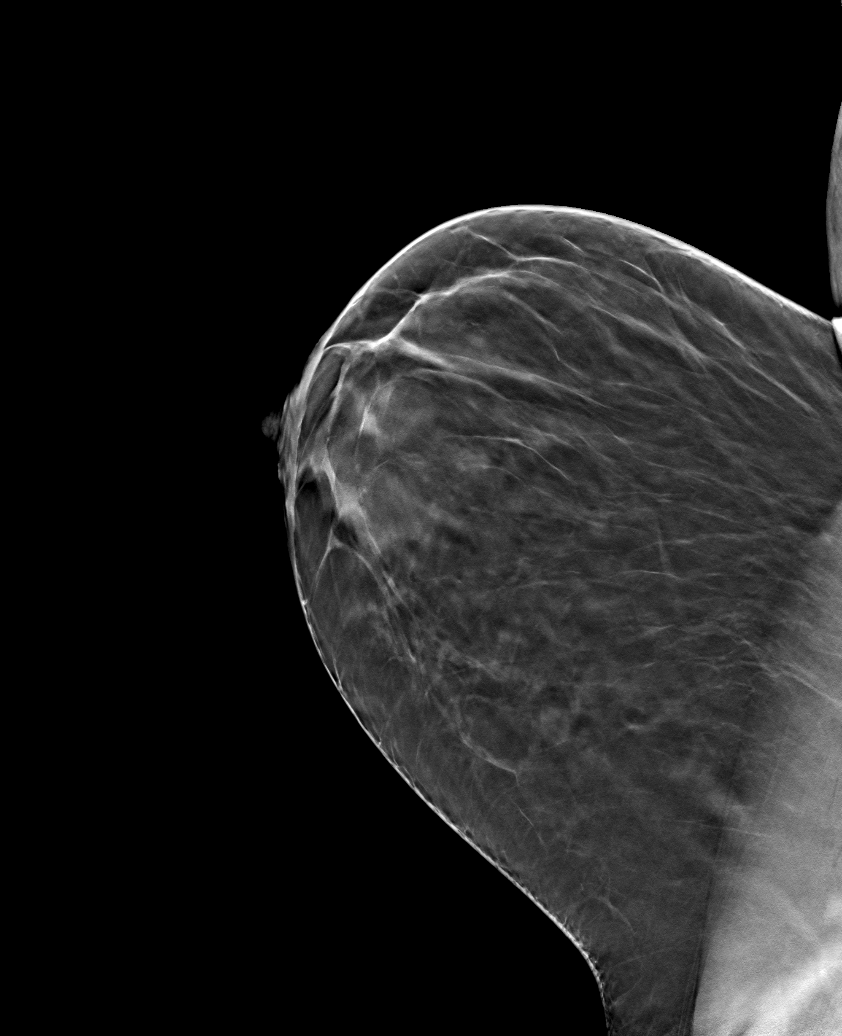

[6 of 30 positions shown; findings below may reference images not displayed]

ACR Breast Density Category b: There are scattered areas of
fibroglandular density.
FINDINGS: In the right breast a possible asymmetry requires further
evaluation.

In the left breast a possible asymmetry as well as a separate area
of possible microcalcifications. Requires further evaluation.

Images were processed with CAD.
IMPRESSION: Further evaluation is suggested for possible asymmetry in the right
breast.

Further evaluation is suggested for possible asymmetry as well as
microcalcifications in the left breast.

RECOMMENDATION:
Diagnostic mammogram and possibly ultrasound of both breasts.
(Code:[id number]-G-99P)

The patient will be contacted regarding the findings, and additional
imaging will be scheduled.

BI-RADS CATEGORY  0: Incomplete. Need additional imaging evaluation
and/or prior mammograms for comparison.

## 2021-08-22 NOTE — Progress Notes (Signed)
Name: [name]   MRN: [id number]    DOB: 10/07/1980   Date:08/23/2021 ? ?     Progress Note ? ?Subjective ? ?Chief Complaint ? ?Follow Up ? ?HPI ? ?Interpreter present during visit  - Macy Mis through [employer]  ? ?Post-auricular nodule: Seen by ENT, Dr. Pryor Ochoa in April 22 and at the time the lump was sore , always on left side. She is no longer having pain and not worried about it.  ? ?Right thumb numbness: going on for about one month ago,  she wakes up with right thumb is numb and resolves throughout the day. She sleeps on top of her right arm, denies any weakness, no rashes  ? ?Adult acne: seeing dermatologist and is doing well on current regiment . Unchanged and still has medication at home but advised her to contact dermatologist to schedule a follow up ? ?Anxiety/Depression :she has 3 sons,  all three going to school, taking time for herself, not taking medication and is doing well without it. She is going for walks and is staying active  She was overwhelmed and depressed in the beginning of the pandemic but doing well now  She has a part time job and is excited about it  ? 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 ?Patient Active Problem List  ? Diagnosis Date Noted  ? S/P cesarean section 01/13/2013  ? Bilateral deafness 01/12/2013  ? ? ?Past Surgical History:  ?Procedure Laterality Date  ? CESAREAN SECTION    ? CESAREAN SECTION N/A 01/13/2013  ? Procedure: REPEAT CESAREAN SECTION;  Surgeon: Thornell Sartorius, MD;  Location: [facility];  Service: Obstetrics;  Laterality: N/A;  Jamie from office called and states pt is deaf and needs interpreter on day of surgery.  EDA Royal called and intrepreter scheduled for day of surgery.  ? CESAREAN SECTION N/A 06/28/2015  ? Procedure: CESAREAN SECTION;  Surgeon: Janyth Contes, MD;  Location: [facility];  Service: Obstetrics;  Laterality: N/A;  ? INTRAUTERINE DEVICE (IUD) INSERTION  02/18/2019  ?  WISDOM TOOTH EXTRACTION    ? ? ?Family History  ?Problem Relation Age of Onset  ? Breast cancer Mother   ?     pt doesnt remember  ? Diabetes Father   ? Hypertension Brother   ? Breast cancer Paternal Grandmother   ? Breast cancer Maternal Aunt   ? Heart attack Paternal Aunt   ? Prostate cancer Paternal Uncle   ? ? ?Social History  ? ?Tobacco Use  ? Smoking status: Never  ? Smokeless tobacco: Never  ?Substance Use Topics  ? Alcohol use: Not Currently  ?  Alcohol/week: 1.0 standard drink  ?  Types: 1 Glasses of wine per week  ? ? ? ?Current Outpatient Medications:  ?  levonorgestrel (MIRENA) 20 MCG/24HR IUD, 1 each by Intrauterine route once., Disp: , Rfl:  ? ?Allergies  ?Allergen Reactions  ? Lexapro [Escitalopram Oxalate] Nausea Only and Hypertension  ? Nickel Rash  ? ? ?I personally reviewed active problem list, medication list, allergies, family history, social history, health maintenance with the patient/caregiver today. ? ? ?ROS ? ?Constitutional: Negative for fever or weight change.  ?Respiratory: Negative for cough and shortness of breath.   ?Cardiovascular: Negative for chest pain or palpitations.  ?Gastrointestinal: Negative for abdominal pain, no bowel changes.  ?Musculoskeletal: Negative for gait problem or joint swelling.  ?Skin: Negative for rash.  ?Neurological:  Negative for dizziness or headache.  ?No other specific complaints in a complete review of systems (except as listed in HPI above).  ? ?Objective ? ?Vitals:  ? 08/23/21 1052  ?BP: 120/70  ?Pulse: 87  ?Resp: 16  ?SpO2: 99%  ?Weight: 170 lb (77.1 kg)  ?Height: '5\' 4"'$  (1.626 m)  ? ? ?Body mass index is 29.18 kg/m?. ? ?Physical Exam ? ?Constitutional: Patient appears well-developed and well-nourished. Overweight.  No distress.  ?HEENT: head atraumatic, normocephalic, pupils equal and reactive to light, neck supple, she still has a posterior auricular small mass, but non tender and per patient no change in size ?Cardiovascular: Normal rate, regular  rhythm and normal heart sounds.  No murmur heard. No BLE edema. ?Pulmonary/Chest: Effort normal and breath sounds normal. No respiratory distress. ?Abdominal: Soft.  There is no tenderness. ?Psychiatric: Patient has a normal mood and affect. behavior is normal. Judgment and thought content normal.  ? ? ?PHQ2/9: ?Depression screen [facility] 2/9 08/23/2021 01/13/2021 07/13/2020 07/13/2020 10/31/2019  ?Decreased Interest 0 0 1 1 0  ?Down, Depressed, Hopeless 0 0 0 0 0  ?PHQ - 2 Score 0 0 1 1 0  ?Altered sleeping 0 1 0 - 0  ?Tired, decreased energy 0 0 1 - 0  ?Change in appetite 0 0 0 - 0  ?Feeling bad or failure about yourself  0 0 0 - 0  ?Trouble concentrating 0 0 0 - 1  ?Moving slowly or fidgety/restless 0 0 0 - 0  ?Suicidal thoughts 0 0 0 - 0  ?PHQ-9 Score 0 1 2 - 1  ?Difficult doing work/chores - - Somewhat difficult - Not difficult at all  ?Some recent data might be hidden  ?  ?phq 9 is negative ? ? ?Fall Risk: ?Fall Risk  08/23/2021 01/13/2021 07/13/2020 10/31/2019 07/23/2019  ?Falls in the past year? 0 0 0 0 0  ?Number falls in past yr: 0 0 0 0 0  ?Injury with Fall? 0 0 0 0 0  ?Risk for fall due to : No Fall Risks - - - -  ?Follow up Falls prevention discussed - - - -  ? ? ? ? ?Functional Status Survey: ?Is the patient deaf or have difficulty hearing?: Yes ?Does the patient have difficulty seeing, even when wearing glasses/contacts?: No ?Does the patient have difficulty concentrating, remembering, or making decisions?: No ?Does the patient have difficulty walking or climbing stairs?: No ?Does the patient have difficulty dressing or bathing?: No ?Does the patient have difficulty doing errands alone such as visiting a doctor's office or shopping?: No ? ? ? ?Assessment & Plan ? ?1. Paresthesia of right thumb ? ?- naproxen (NAPROSYN) 500 MG tablet; Take 1 tablet (500 mg total) by mouth 2 (two) times daily with a meal.  Dispense: 14 tablet; Refill: 0 ?- Elastic Bandages & Supports (THUMB BRACE) MISC; 1 each by Does not apply route  daily.  Dispense: 1 each; Refill: 0 ? ?2. De Quervain's tenosynovitis, right ? ?Topical medication prn , voltaren once finished with naproxen ? ?3. Adult acne ? ?Keep follow up with Dermatologist  ? ? ?4. Posterior auricular lymphadenopathy ? ? ?5. Depression with anxiety ?  ?

## 2021-08-23 ENCOUNTER — Encounter: Payer: Self-pay | Admitting: Family Medicine

## 2021-08-23 ENCOUNTER — Ambulatory Visit (INDEPENDENT_AMBULATORY_CARE_PROVIDER_SITE_OTHER): Payer: Medicare Other | Admitting: Family Medicine

## 2021-08-23 ENCOUNTER — Telehealth: Payer: Self-pay | Admitting: Family Medicine

## 2021-08-23 VITALS — BP 120/70 | HR 87 | Resp 16 | Ht 64.0 in | Wt 170.0 lb

## 2021-08-23 DIAGNOSIS — R202 Paresthesia of skin: Secondary | ICD-10-CM

## 2021-08-23 DIAGNOSIS — F418 Other specified anxiety disorders: Secondary | ICD-10-CM | POA: Diagnosis not present

## 2021-08-23 DIAGNOSIS — L709 Acne, unspecified: Secondary | ICD-10-CM

## 2021-08-23 DIAGNOSIS — R590 Localized enlarged lymph nodes: Secondary | ICD-10-CM | POA: Diagnosis not present

## 2021-08-23 DIAGNOSIS — M654 Radial styloid tenosynovitis [de Quervain]: Secondary | ICD-10-CM

## 2021-08-23 MED ORDER — THUMB BRACE MISC: 1.0000 | Freq: Every day | 0 refills | Status: DC

## 2021-08-23 MED ORDER — DICLOFENAC SODIUM 1 % EX GEL: 2.0000 g | Freq: Four times a day (QID) | CUTANEOUS | 0 refills | Status: DC

## 2021-08-23 MED ORDER — NAPROXEN 500 MG PO TABS: 500.0000 mg | ORAL_TABLET | Freq: Two times a day (BID) | ORAL | 0 refills | Status: DC

## 2021-08-23 NOTE — Telephone Encounter (Signed)
LVM for Stacy with  [facility] informing her that an in person sign language interpreter was in route to our office for patient.

## 2021-08-23 NOTE — Telephone Encounter (Signed)
Interpreting services called in for pt who hads appt scheduled for 10:40 but needs link or interpreting services . Please call back ?

## 2022-01-20 NOTE — Progress Notes (Signed)
Name: [name]   MRN: [id number]    DOB: [birth year]   Date:01/23/2022       Progress Note  Subjective  Chief Complaint  Annual Exam  HPI  Patient presents  for medicare wellness.   Interpreter: Mary Crumb   Diet: balanced  Exercise: continue regular physical activity    Flowsheet Row Office Visit from 07/13/2020 in [facility]  AUDIT-C Score 0      Depression: Phq 9 is  negative    01/23/2022   11:08 AM 01/23/2022   11:07 AM 08/23/2021   10:46 AM 01/13/2021    9:06 AM 07/13/2020   10:39 AM  Depression screen PHQ 2/9  Decreased Interest 0 0 0 0 1  Down, Depressed, Hopeless 0 0 0 0 0  PHQ - 2 Score 0 0 0 0 1  Altered sleeping 0  0 1 0  Tired, decreased energy 1  0 0 1  Change in appetite 0  0 0 0  Feeling bad or failure about yourself  0  0 0 0  Trouble concentrating 0  0 0 0  Moving slowly or fidgety/restless 0  0 0 0  Suicidal thoughts 0  0 0 0  PHQ-9 Score 1  0 1 2  Difficult doing work/chores Not difficult at all    Somewhat difficult   Hypertension: BP Readings from Last 3 Encounters:  01/23/22 114/72  08/23/21 120/70  01/13/21 116/80   Obesity: Wt Readings from Last 3 Encounters:  01/23/22 168 lb 9.6 oz (76.5 kg)  08/23/21 170 lb (77.1 kg)  01/13/21 170 lb (77.1 kg)   BMI Readings from Last 3 Encounters:  01/23/22 34.05 kg/m  08/23/21 29.18 kg/m  01/13/21 29.18 kg/m     Vaccines:   HPV: 2 of 3 Tdap: up to date Shingrix: N/A Pneumonia: N/A Flu: 2020 COVID-19: up to date   Hep C Screening: 01/13/21 STD testing and prevention (HIV/chl/gon/syphilis): 04/06/15 Intimate partner violence: negative screen  Sexual History : one partner - husband, no pain or intermenstrual bleeding  Menstrual History/LMP/Abnormal Bleeding: has IUD  Discussed importance of follow up if any post-menopausal bleeding: not applicable  Incontinence Symptoms: negative for symptoms   Breast cancer:  - Last Mammogram: 04/2021  - BRCA gene screening:  mother had breast cancer, checking MRI   Osteoporosis Prevention : Discussed high calcium and vitamin D supplementation, weight bearing exercises Bone density :not applicable   Cervical cancer screening: Ordered today  Skin cancer: Discussed monitoring for atypical lesions  Colorectal cancer: N/A   Lung cancer:  Low Dose CT Chest recommended if Age 52-80 years, 20 pack-year currently smoking OR have quit w/in 15years. Patient does not qualify for screen   ECG: 07/23/19  Advanced Care Planning: A voluntary discussion about advance care planning including the explanation and discussion of advance directives.  Discussed health care proxy and Living will, and the patient was able to identify a health care proxy as husband .  Patient does not have a living will and power of attorney of health care   Lipids: Lab Results  Component Value Date   CHOL 133 01/13/2021   Lab Results  Component Value Date   HDL 37 (L) 01/13/2021   Lab Results  Component Value Date   LDLCALC 82 01/13/2021   Lab Results  Component Value Date   TRIG 55 01/13/2021   Lab Results  Component Value Date   CHOLHDL 3.6 01/13/2021   No results found for: "LDLDIRECT"  Glucose: Glucose, Bld  Date Value Ref Range Status  07/23/2019 88 65 - 99 mg/dL Final    Comment:    .            Fasting reference interval .   03/12/2019 113 (H) 70 - 99 mg/dL Final  02/20/2019 101 (H) 70 - 99 mg/dL Final    Patient Active Problem List   Diagnosis Date Noted  . S/P cesarean section 01/13/2013  . Bilateral deafness 01/12/2013    Past Surgical History:  Procedure Laterality Date  . CESAREAN SECTION    . CESAREAN SECTION N/A 01/13/2013   Procedure: REPEAT CESAREAN SECTION;  Surgeon: Thornell Sartorius, MD;  Location: [facility];  Service: Obstetrics;  Laterality: N/A;  Jamie from office called and states pt is deaf and needs interpreter on day of surgery.  EDA Royal called and intrepreter scheduled for day of surgery.  . CESAREAN  SECTION N/A 06/28/2015   Procedure: CESAREAN SECTION;  Surgeon: Janyth Contes, MD;  Location: [facility];  Service: Obstetrics;  Laterality: N/A;  . INTRAUTERINE DEVICE (IUD) INSERTION  02/18/2019  . WISDOM TOOTH EXTRACTION      Family History  Problem Relation Age of Onset  . Breast cancer Mother        pt doesnt remember  . Diabetes Father   . Hypertension Brother   . Breast cancer Paternal Grandmother   . Breast cancer Maternal Aunt   . Heart attack Paternal Aunt   . Prostate cancer Paternal Uncle     Social History   Socioeconomic History  . Marital status: Married    Spouse name: Sally-Ann Cutbirth  . Number of children: 3  . Years of education: Not on file  . Highest education level: Associate degree: academic program  Occupational History  . Occupation: disabled     Comment: hearing  Tobacco Use  . Smoking status: Never  . Smokeless tobacco: Never  Vaping Use  . Vaping Use: Never used  Substance and Sexual Activity  . Alcohol use: Not Currently    Alcohol/week: 1.0 standard drink of alcohol    Types: 1 Glasses of wine per week  . Drug use: Never  . Sexual activity: Yes    Partners: Male    Birth control/protection: I.U.D.  Other Topics Concern  . Not on file  Social History Narrative   Married with 3 children, stay at home with her children   Social Determinants of Health   Financial Resource Strain: High Risk (01/23/2022)   Overall Financial Resource Strain (CARDIA)   . Difficulty of Paying Living Expenses: Hard  Food Insecurity: Food Insecurity Present (01/23/2022)   Hunger Vital Sign   . Worried About Charity fundraiser in the Last Year: Sometimes true   . Ran Out of Food in the Last Year: Often true  Transportation Needs: No Transportation Needs (01/23/2022)   PRAPARE - Transportation   . Lack of Transportation (Medical): No   . Lack of Transportation (Non-Medical): No  Physical Activity: Sufficiently Active (01/23/2022)   Exercise Vital Sign   . Days of  Exercise per Week: 3 days   . Minutes of Exercise per Session: 80 min  Stress: No Stress Concern Present (01/23/2022)   [place]   . Feeling of Stress : Not at all  Social Connections: Moderately Isolated (01/23/2022)   Social Connection and Isolation Panel [NHANES]   . Frequency of Communication with Friends and Family: More than three times  a week   . Frequency of Social Gatherings with Friends and Family: More than three times a week   . Attends Religious Services: Never   . Active Member of Clubs or Organizations: No   . Attends Archivist Meetings: Never   . Marital Status: Married  Human resources officer Violence: Not At Risk (01/23/2022)   Humiliation, Afraid, Rape, and Kick questionnaire   . Fear of Current or Ex-Partner: No   . Emotionally Abused: No   . Physically Abused: No   . Sexually Abused: No     Current Outpatient Medications:  .  diclofenac Sodium (VOLTAREN) 1 % GEL, Apply 2 g topically 4 (four) times daily., Disp: 100 g, Rfl: 0 .  Elastic Bandages & Supports (THUMB BRACE) MISC, 1 each by Does not apply route daily., Disp: 1 each, Rfl: 0 .  levonorgestrel (MIRENA) 20 MCG/24HR IUD, 1 each by Intrauterine route once., Disp: , Rfl:  .  naproxen (NAPROSYN) 500 MG tablet, Take 1 tablet (500 mg total) by mouth 2 (two) times daily with a meal. (Patient not taking: Reported on 01/23/2022), Disp: 14 tablet, Rfl: 0  Allergies  Allergen Reactions  . Lexapro [Escitalopram Oxalate] Nausea Only and Hypertension  . Nickel Rash     ROS  Constitutional: Negative for fever or weight change.  Respiratory: Negative for cough and shortness of breath.   Cardiovascular: Negative for chest pain or palpitations.  Gastrointestinal: Negative for abdominal pain, no bowel changes.  Musculoskeletal: Negative for gait problem or joint swelling.  Pain on top of right foot, since started working, discussed changing shoe  wear Skin: Negative for rash.  Neurological: Negative for dizziness or headache.  No other specific complaints in a complete review of systems (except as listed in HPI above).   Objective  Vitals:   01/23/22 1034  BP: 114/72  Pulse: 70  Resp: 16  Temp: 98.1 F (36.7 C)  TempSrc: Oral  SpO2: 98%  Weight: 168 lb 9.6 oz (76.5 kg)  Height: 4' 11" (1.499 m)    Body mass index is 34.05 kg/m.  Physical Exam  Constitutional: Patient appears well-developed and well-nourished. No distress.  HENT: Head: Normocephalic and atraumatic. Ears: B TMs ok, no erythema or effusion; Nose: Nose normal. Mouth/Throat: Oropharynx is clear and moist. No oropharyngeal exudate.  Eyes: Conjunctivae and EOM are normal. Pupils are equal, round, and reactive to light. No scleral icterus.  Neck: Normal range of motion. Neck supple. No JVD present. No thyromegaly present.  Cardiovascular: Normal rate, regular rhythm and normal heart sounds.  No murmur heard. No BLE edema. Pulmonary/Chest: Effort normal and breath sounds normal. No respiratory distress. Abdominal: Soft. Bowel sounds are normal, no distension. There is no tenderness. no masses Breast: no lumps or masses, no nipple discharge or rashes FEMALE GENITALIA:  External genitalia normal External urethra normal Vaginal vault normal without discharge or lesions Cervix normal without discharge or lesions Bimanual exam normal without masses RECTAL: no rectal masses or hemorrhoids Musculoskeletal: Normal range of motion, no joint effusions. No gross deformities Neurological: he is alert and oriented to person, place, and time. No cranial nerve deficit. Coordination, balance, strength, speech and gait are normal.  Skin: Skin is warm and dry. No rash noted. No erythema.  Psychiatric: Patient has a normal mood and affect. behavior is normal. Judgment and thought content normal.    Fall Risk:    01/23/2022   10:45 AM 08/23/2021   10:46 AM 01/13/2021    9:06  AM  07/13/2020   10:38 AM 10/31/2019   10:56 AM  Fall Risk   Falls in the past year? 0 0 0 0 0  Number falls in past yr:  0 0 0 0  Injury with Fall?  0 0 0 0  Risk for fall due to : No Fall Risks No Fall Risks     Follow up Falls prevention discussed Falls prevention discussed        Functional Status Survey: Is the patient deaf or have difficulty hearing?: Yes (deaf) Does the patient have difficulty seeing, even when wearing glasses/contacts?: Yes (Color blind) Does the patient have difficulty concentrating, remembering, or making decisions?: No Does the patient have difficulty walking or climbing stairs?: No Does the patient have difficulty dressing or bathing?: No Does the patient have difficulty doing errands alone such as visiting a doctor's office or shopping?: No  Eye exam: two years ago  Dental exam: every 6 months  Hearing: unable to hear, came in interpreter   Assessment & Plan  1. Medicare annual wellness visit, subsequent  - HPV 9-valent vaccine,Recombinat  2. Cervical cancer screening  - Cytology - PAP  3. Need for HPV vaccination  - HPV 9-valent vaccine,Recombinat  4. Breast cancer screening by mammogram  - MR BREAST BILATERAL W CONTRAST; Future - MM 3D SCREEN BREAST BILATERAL; Future  5. Long-term use of high-risk medication   6. Family history of breast cancer in mother  - MR BREAST BILATERAL W CONTRAST; Future - MM 3D SCREEN BREAST BILATERAL; Future  7. Need for HPV vaccine   -USPSTF grade A and B recommendations reviewed with patient; age-appropriate recommendations, preventive care, screening tests, etc discussed and encouraged; healthy living encouraged; see AVS for patient education given to patient -Discussed importance of 150 minutes of physical activity weekly, eat two servings of fish weekly, eat one serving of tree nuts ( cashews, pistachios, pecans, almonds.Marland Kitchen) every other day, eat 6 servings of fruit/vegetables daily and drink plenty of  water and avoid sweet beverages.   -Reviewed Health Maintenance: Yes.

## 2022-01-20 NOTE — Patient Instructions (Signed)

## 2022-01-23 ENCOUNTER — Ambulatory Visit (INDEPENDENT_AMBULATORY_CARE_PROVIDER_SITE_OTHER): Payer: Medicare Other | Admitting: Family Medicine

## 2022-01-23 ENCOUNTER — Encounter: Payer: Self-pay | Admitting: Family Medicine

## 2022-01-23 ENCOUNTER — Other Ambulatory Visit (HOSPITAL_COMMUNITY)
Admission: RE | Admit: 2022-01-23 | Discharge: 2022-01-23 | Disposition: A | Discharge status: Home / Self Care | Payer: Medicare Other | Source: Ambulatory Visit | Attending: Family Medicine | Admitting: Family Medicine

## 2022-01-23 VITALS — BP 114/72 | HR 70 | Temp 98.1°F | Resp 16 | Ht 59.0 in | Wt 168.6 lb

## 2022-01-23 DIAGNOSIS — Z803 Family history of malignant neoplasm of breast: Secondary | ICD-10-CM | POA: Diagnosis not present

## 2022-01-23 DIAGNOSIS — Z1231 Encounter for screening mammogram for malignant neoplasm of breast: Secondary | ICD-10-CM

## 2022-01-23 DIAGNOSIS — Z23 Encounter for immunization: Secondary | ICD-10-CM

## 2022-01-23 DIAGNOSIS — Z124 Encounter for screening for malignant neoplasm of cervix: Secondary | ICD-10-CM | POA: Diagnosis not present

## 2022-01-23 DIAGNOSIS — Z0000 Encounter for general adult medical examination without abnormal findings: Secondary | ICD-10-CM | POA: Diagnosis not present

## 2022-01-23 DIAGNOSIS — Z01419 Encounter for gynecological examination (general) (routine) without abnormal findings: Secondary | ICD-10-CM | POA: Diagnosis not present

## 2022-01-23 DIAGNOSIS — Z79899 Other long term (current) drug therapy: Secondary | ICD-10-CM

## 2022-01-23 DIAGNOSIS — Z1151 Encounter for screening for human papillomavirus (HPV): Secondary | ICD-10-CM | POA: Diagnosis not present

## 2022-01-24 LAB — COMPLETE METABOLIC PANEL WITH GFR
AG Ratio: 1.7 (calc) (ref 1.0–2.5)
ALT: 11 U/L (ref 6–29)
AST: 13 U/L (ref 10–30)
Albumin: 4.4 g/dL (ref 3.6–5.1)
Alkaline phosphatase (APISO): 48 U/L (ref 31–125)
BUN: 12 mg/dL (ref 7–25)
CO2: 28 mmol/L (ref 20–32)
Calcium: 9.1 mg/dL (ref 8.6–10.2)
Chloride: 103 mmol/L (ref 98–110)
Creat: 0.66 mg/dL (ref 0.50–0.97)
Globulin: 2.6 g/dL (calc) (ref 1.9–3.7)
Glucose, Bld: 87 mg/dL (ref 65–99)
Potassium: 4 mmol/L (ref 3.5–5.3)
Sodium: 139 mmol/L (ref 135–146)
Total Bilirubin: 0.5 mg/dL (ref 0.2–1.2)
Total Protein: 7 g/dL (ref 6.1–8.1)
eGFR: 114 mL/min/{1.73_m2} (ref >=60)

## 2022-01-25 LAB — CYTOLOGY - PAP
Comment: NEGATIVE
Diagnosis: NEGATIVE
High risk HPV: NEGATIVE

## 2022-02-17 NOTE — Progress Notes (Deleted)
Name: [name]   MRN: [id number]    DOB: July 13, 1980   Date:02/17/2022       Progress Note  Subjective  Chief Complaint  Follow Up  HPI  Interpreter present during visit  - Macy Mis through [employer] ink   Post-auricular nodule: Seen by ENT, Dr. Pryor Ochoa in April 22 and at the time the lump was sore , always on left side. She is no longer having pain and not worried about it.   Right thumb numbness: going on for about one month ago,  she wakes up with right thumb is numb and resolves throughout the day. She sleeps on top of her right arm, denies any weakness, no rashes   Adult acne: seeing dermatologist and is doing well on current regiment . Unchanged and still has medication at home but advised her to contact dermatologist to schedule a follow up  Anxiety/Depression :she has 3 sons,  all three going to school, taking time for herself, not taking medication and is doing well without it. 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  EDA Royal called and intrepreter scheduled for day of surgery.   CESAREAN SECTION N/A 06/28/2015   Procedure: CESAREAN SECTION;  Surgeon: Janyth Contes, MD;  Location: [facility];  Service: Obstetrics;  Laterality: N/A;   INTRAUTERINE DEVICE (IUD) INSERTION  02/18/2019    WISDOM TOOTH EXTRACTION      Family History  Problem Relation Age of Onset   Breast cancer Mother        pt doesnt remember   Diabetes Father    Hypertension Brother    Breast cancer Paternal Grandmother    Breast cancer Maternal Aunt    Heart attack Paternal Aunt    Prostate cancer Paternal Uncle     Social History   Tobacco Use   Smoking status: Never   Smokeless tobacco: Never  Substance Use Topics   Alcohol use: Not Currently    Alcohol/week: 1.0 standard drink of alcohol    Types: 1 Glasses of wine per week     Current Outpatient Medications:    diclofenac Sodium (VOLTAREN) 1 % GEL, Apply 2 g topically 4 (four) times daily., Disp: 100 g, Rfl: 0   Elastic Bandages & Supports (THUMB BRACE) MISC, 1 each by Does not apply route daily., Disp: 1 each, Rfl: 0   levonorgestrel (MIRENA) 20 MCG/24HR IUD, 1 each by Intrauterine route once., Disp: , Rfl:    naproxen (NAPROSYN) 500 MG tablet, Take 1 tablet (500 mg total) by mouth 2 (two) times daily with a meal. (Patient not taking: Reported on 01/23/2022), Disp: 14 tablet, Rfl: 0  Allergies  Allergen Reactions   Lexapro [Escitalopram Oxalate] Nausea Only and Hypertension  Nickel Rash    I personally reviewed active problem list, medication list, allergies, family history, social history, health maintenance with the patient/caregiver today.   ROS  ***  Objective  There were no vitals filed for this visit.  There is no height or weight on file to calculate BMI.  Physical Exam ***  Recent Results (from the past 2160 hour(s))  Cytology - PAP     Status: None   Collection Time: 01/23/22 11:10 AM  Result Value Ref Range   High risk HPV Negative    Adequacy      Satisfactory for evaluation; transformation zone component PRESENT.   Diagnosis      - Negative for intraepithelial lesion or malignancy (NILM)   Comment Normal Reference Range HPV - Negative   COMPLETE METABOLIC PANEL WITH GFR     Status: None   Collection  Time: 01/23/22 11:40 AM  Result Value Ref Range   Glucose, Bld 87 65 - 99 mg/dL    Comment: .            Fasting reference interval .    BUN 12 7 - 25 mg/dL   Creat 0.66 0.50 - 0.97 mg/dL   eGFR 114 > OR = 60 mL/min/1.61m   BUN/Creatinine Ratio SEE NOTE: 6 - 22 (calc)    Comment:    Not Reported: BUN and Creatinine are within    reference range. .    Sodium 139 135 - 146 mmol/L   Potassium 4.0 3.5 - 5.3 mmol/L   Chloride 103 98 - 110 mmol/L   CO2 28 20 - 32 mmol/L   Calcium 9.1 8.6 - 10.2 mg/dL   Total Protein 7.0 6.1 - 8.1 g/dL   Albumin 4.4 3.6 - 5.1 g/dL   Globulin 2.6 1.9 - 3.7 g/dL (calc)   AG Ratio 1.7 1.0 - 2.5 (calc)   Total Bilirubin 0.5 0.2 - 1.2 mg/dL   Alkaline phosphatase (APISO) 48 31 - 125 U/L   AST 13 10 - 30 U/L   ALT 11 6 - 29 U/L    PHQ2/9:    01/23/2022   11:08 AM 01/23/2022   11:07 AM 08/23/2021   10:46 AM 01/13/2021    9:06 AM 07/13/2020   10:39 AM  Depression screen PHQ 2/9  Decreased Interest 0 0 0 0 1  Down, Depressed, Hopeless 0 0 0 0 0  PHQ - 2 Score 0 0 0 0 1  Altered sleeping 0  0 1 0  Tired, decreased energy 1  0 0 1  Change in appetite 0  0 0 0  Feeling bad or failure about yourself  0  0 0 0  Trouble concentrating 0  0 0 0  Moving slowly or fidgety/restless 0  0 0 0  Suicidal thoughts 0  0 0 0  PHQ-9 Score 1  0 1 2  Difficult doing work/chores Not difficult at all    Somewhat difficult    phq 9 is {gen pos [id number]}  Fall Risk:    01/23/2022   10:45 AM 08/23/2021   10:46 AM 01/13/2021    9:06 AM 07/13/2020   10:38 AM 10/31/2019   10:56 AM  Fall Risk   Falls in the past year? 0 0 0 0 0  Number falls in past yr:  0 0 0 0  Injury with Fall?  0 0 0 0  Risk for fall due to : No Fall Risks No Fall Risks  Follow up Falls prevention discussed Falls prevention discussed         Functional Status Survey:      Assessment & Plan  *** There are no diagnoses linked to this encounter.

## 2022-02-21 ENCOUNTER — Ambulatory Visit: Payer: Medicare Other | Admitting: Family Medicine

## 2022-03-24 NOTE — Progress Notes (Signed)
Name: [name]   MRN: [id number]    DOB: 02-Feb-1981   Date:03/27/2022       Progress Note  Subjective  Chief Complaint  Follow Up  HPI   Right thumb numbness: she wore a brace, took NSAID's and is doing well now, no problems   Adult acne: she states no longer taking medications and skin is clear. She states sometimes has a facial.   Anxiety/Depression :she has 3 sons,  all three going to school, taking time for herself, not taking medication and is doing well without it. She is going for walks and is staying active  She was overwhelmed and depressed in the beginning of the pandemic when she was home schooling. She is doing well now, she likes her job   Hearing loss: secondary to meningitis at age 41 yo, she is now working at [employer], Systems analyst , she had a interpreter with her today   Obesity: BMI is now above 35, she states she has been drinking Ginger Ale and juice, she has also been eating more sweets, discussed BMI below 30 her weigh needs to be 148 lbs. She will try to modify her diet   Patient Active Problem List   Diagnosis Date Noted   Adult acne 03/27/2022   Depression with anxiety 03/27/2022   Family history of breast cancer in mother 03/27/2022   Overweight (BMI 25.0-29.9) 03/27/2022   S/P cesarean section 01/13/2013   Bilateral deafness 01/12/2013    Past Surgical History:  Procedure Laterality Date   CESAREAN SECTION     CESAREAN SECTION N/A 01/13/2013   Procedure: REPEAT CESAREAN SECTION;  Surgeon: Thornell Sartorius, MD;  Location: [facility];  Service: Obstetrics;  Laterality: N/A;  Jamie from office called and states pt is deaf and needs interpreter on day of surgery.  EDA Royal called and intrepreter scheduled for day of surgery.   CESAREAN SECTION N/A 06/28/2015   Procedure: CESAREAN SECTION;  Surgeon: Janyth Contes, MD;  Location: [facility];  Service: Obstetrics;  Laterality: N/A;   INTRAUTERINE DEVICE (IUD) INSERTION  02/18/2019   WISDOM TOOTH  EXTRACTION      Family History  Problem Relation Age of Onset   Breast cancer Mother        pt doesnt remember   Diabetes Father    Hypertension Brother    Breast cancer Paternal Grandmother    Breast cancer Maternal Aunt    Heart attack Paternal Aunt    Prostate cancer Paternal Uncle     Social History   Tobacco Use   Smoking status: Never   Smokeless tobacco: Never  Substance Use Topics   Alcohol use: Not Currently    Alcohol/week: 1.0 standard drink of alcohol    Types: 1 Glasses of wine per week     Current Outpatient Medications:    levonorgestrel (MIRENA) 20 MCG/24HR IUD, 1 each by Intrauterine route once., Disp: , Rfl:    Tdap (ADACEL) 10-18-13.5 LF-MCG/0.5 injection, Inject 0.5 mLs into the muscle once for 1 dose., Disp: 0.5 mL, Rfl: 0  Allergies  Allergen Reactions   Lexapro [Escitalopram Oxalate] Nausea Only and Hypertension   Nickel Rash    I personally reviewed active problem list, medication list, allergies, family history, social history, health maintenance with the patient/caregiver today.   ROS  Constitutional: Negative for fever , positive weight change.  Respiratory: Negative for cough and shortness of breath.   Cardiovascular: Negative for chest pain or palpitations.  Gastrointestinal: Negative for  abdominal pain, no bowel changes.  Musculoskeletal: Negative for gait problem or joint swelling.  Skin: Negative for rash.  Neurological: Negative for dizziness or headache.  No other specific complaints in a complete review of systems (except as listed in HPI above).   Objective  Vitals:   03/27/22 1049  BP: 126/74  Pulse: 86  Resp: 16  SpO2: 99%  Weight: 175 lb (79.4 kg)  Height: '4\' 11"'  (1.499 m)    Body mass index is 35.35 kg/m.  Physical Exam  Constitutional: Patient appears well-developed and well-nourished. Obese  No distress.  HEENT: head atraumatic, normocephalic, pupils equal and reactive to light,, neck supple Cardiovascular:  Normal rate, regular rhythm and normal heart sounds.  No murmur heard. No BLE edema. Pulmonary/Chest: Effort normal and breath sounds normal. No respiratory distress. Abdominal: Soft.  There is no tenderness. Psychiatric: Patient has a normal mood and affect. behavior is normal. Judgment and thought content normal.   Recent Results (from the past 2160 hour(s))  Cytology - PAP     Status: None   Collection Time: 01/23/22 11:10 AM  Result Value Ref Range   High risk HPV Negative    Adequacy      Satisfactory for evaluation; transformation zone component PRESENT.   Diagnosis      - Negative for intraepithelial lesion or malignancy (NILM)   Comment Normal Reference Range HPV - Negative   COMPLETE METABOLIC PANEL WITH GFR     Status: None   Collection Time: 01/23/22 11:40 AM  Result Value Ref Range   Glucose, Bld 87 65 - 99 mg/dL    Comment: .            Fasting reference interval .    BUN 12 7 - 25 mg/dL   Creat 0.66 0.50 - 0.97 mg/dL   eGFR 114 > OR = 60 mL/min/1.67m   BUN/Creatinine Ratio SEE NOTE: 6 - 22 (calc)    Comment:    Not Reported: BUN and Creatinine are within    reference range. .    Sodium 139 135 - 146 mmol/L   Potassium 4.0 3.5 - 5.3 mmol/L   Chloride 103 98 - 110 mmol/L   CO2 28 20 - 32 mmol/L   Calcium 9.1 8.6 - 10.2 mg/dL   Total Protein 7.0 6.1 - 8.1 g/dL   Albumin 4.4 3.6 - 5.1 g/dL   Globulin 2.6 1.9 - 3.7 g/dL (calc)   AG Ratio 1.7 1.0 - 2.5 (calc)   Total Bilirubin 0.5 0.2 - 1.2 mg/dL   Alkaline phosphatase (APISO) 48 31 - 125 U/L   AST 13 10 - 30 U/L   ALT 11 6 - 29 U/L    PHQ2/9:    03/27/2022   10:48 AM 01/23/2022   11:08 AM 01/23/2022   11:07 AM 08/23/2021   10:46 AM 01/13/2021    9:06 AM  Depression screen PHQ 2/9  Decreased Interest 0 0 0 0 0  Down, Depressed, Hopeless 0 0 0 0 0  PHQ - 2 Score 0 0 0 0 0  Altered sleeping 0 0  0 1  Tired, decreased energy 0 1  0 0  Change in appetite 0 0  0 0  Feeling bad or failure about yourself  0 0  0  0  Trouble concentrating 0 0  0 0  Moving slowly or fidgety/restless 0 0  0 0  Suicidal thoughts 0 0  0 0  PHQ-9 Score 0 1  0 1  Difficult doing work/chores  Not difficult at all       phq 9 is negative   Fall Risk:    03/27/2022   10:48 AM 01/23/2022   10:45 AM 08/23/2021   10:46 AM 01/13/2021    9:06 AM 07/13/2020   10:38 AM  Fall Risk   Falls in the past year? 0 0 0 0 0  Number falls in past yr: 0  0 0 0  Injury with Fall? 0  0 0 0  Risk for fall due to : No Fall Risks No Fall Risks No Fall Risks    Follow up Falls prevention discussed Falls prevention discussed Falls prevention discussed        Functional Status Survey: Is the patient deaf or have difficulty hearing?: Yes Does the patient have difficulty seeing, even when wearing glasses/contacts?: No Does the patient have difficulty concentrating, remembering, or making decisions?: Yes Does the patient have difficulty walking or climbing stairs?: No Does the patient have difficulty dressing or bathing?: No Does the patient have difficulty doing errands alone such as visiting a doctor's office or shopping?: No    Assessment & Plan  1. Adult acne  Doing well   2. Family history of breast cancer in mother  She will schedule mammogram   3. Depression with anxiety  Doing well now   4. Need for Tdap vaccination  - Tdap (ADACEL) 10-18-13.5 LF-MCG/0.5 injection; Inject 0.5 mLs into the muscle once for 1 dose.  Dispense: 0.5 mL; Refill: 0  5. Needs flu shot  - Flu Vaccine QUAD 6+ mos PF IM (Fluarix Quad PF)  6. Obesity (BMI 30.0-34.9)  Discussed with the patient the risk posed by an increased BMI. Discussed importance of portion control, calorie counting and at least 150 minutes of physical activity weekly. Avoid sweet beverages and drink more water. Eat at least 6 servings of fruit and vegetables daily

## 2022-03-27 ENCOUNTER — Ambulatory Visit (INDEPENDENT_AMBULATORY_CARE_PROVIDER_SITE_OTHER): Payer: Medicare Other | Admitting: Family Medicine

## 2022-03-27 ENCOUNTER — Encounter: Payer: Self-pay | Admitting: Family Medicine

## 2022-03-27 VITALS — BP 126/74 | HR 86 | Resp 16 | Ht 59.0 in | Wt 175.0 lb

## 2022-03-27 DIAGNOSIS — F418 Other specified anxiety disorders: Secondary | ICD-10-CM | POA: Diagnosis not present

## 2022-03-27 DIAGNOSIS — Z803 Family history of malignant neoplasm of breast: Secondary | ICD-10-CM | POA: Diagnosis not present

## 2022-03-27 DIAGNOSIS — E669 Obesity, unspecified: Secondary | ICD-10-CM | POA: Insufficient documentation

## 2022-03-27 DIAGNOSIS — L709 Acne, unspecified: Secondary | ICD-10-CM | POA: Diagnosis not present

## 2022-03-27 DIAGNOSIS — Z23 Encounter for immunization: Secondary | ICD-10-CM | POA: Diagnosis not present

## 2022-03-27 MED ORDER — TETANUS-DIPHTH-ACELL PERTUSSIS 5-2-15.5 LF-MCG/0.5 IM SUSP: 0.5000 mL | Freq: Once | INTRAMUSCULAR | 0 refills | Status: AC

## 2022-04-24 ENCOUNTER — Ambulatory Visit
Admission: RE | Admit: 2022-04-24 | Discharge: 2022-04-24 | Disposition: A | Discharge status: Home / Self Care | Payer: Medicare Other | Source: Ambulatory Visit | Attending: Family Medicine | Admitting: Family Medicine

## 2022-04-24 DIAGNOSIS — Z803 Family history of malignant neoplasm of breast: Secondary | ICD-10-CM | POA: Diagnosis not present

## 2022-04-24 DIAGNOSIS — Z1231 Encounter for screening mammogram for malignant neoplasm of breast: Secondary | ICD-10-CM | POA: Insufficient documentation

## 2022-06-01 ENCOUNTER — Telehealth: Payer: Self-pay | Admitting: Family Medicine

## 2022-06-01 NOTE — Telephone Encounter (Signed)
Copied from [place] [id number]. Topic: General - Other >> Jun 01, 2022 12:06 PM [name] wrote: Reason for CRM: Pt called to speak with Dr. Ancil Boozer and go over mammogram result/ please advise and call [phone]

## 2022-06-02 NOTE — Telephone Encounter (Signed)
Called patient through an [name] interpreter phone system. Interpreter translated a vm letting her know her mammogram was negative and she can repeat in 1 year.

## 2022-11-06 DIAGNOSIS — Z114 Encounter for screening for human immunodeficiency virus [HIV]: Secondary | ICD-10-CM | POA: Diagnosis not present

## 2022-11-06 DIAGNOSIS — Z79899 Other long term (current) drug therapy: Secondary | ICD-10-CM | POA: Diagnosis not present

## 2023-01-29 ENCOUNTER — Ambulatory Visit: Payer: Medicare Other | Admitting: Family Medicine

## 2023-03-29 ENCOUNTER — Ambulatory Visit: Payer: Medicare Other | Admitting: Family Medicine

## 2023-12-03 ENCOUNTER — Emergency Department

## 2023-12-03 ENCOUNTER — Other Ambulatory Visit: Payer: Self-pay

## 2023-12-03 ENCOUNTER — Emergency Department
Admission: EM | Admit: 2023-12-03 | Discharge: 2023-12-04 | Disposition: A | Discharge status: Home / Self Care | Attending: Emergency Medicine | Admitting: Emergency Medicine

## 2023-12-03 DIAGNOSIS — R200 Anesthesia of skin: Secondary | ICD-10-CM | POA: Diagnosis not present

## 2023-12-03 DIAGNOSIS — M7918 Myalgia, other site: Secondary | ICD-10-CM | POA: Insufficient documentation

## 2023-12-03 DIAGNOSIS — R0789 Other chest pain: Secondary | ICD-10-CM | POA: Insufficient documentation

## 2023-12-03 DIAGNOSIS — M542 Cervicalgia: Secondary | ICD-10-CM | POA: Diagnosis not present

## 2023-12-03 DIAGNOSIS — R202 Paresthesia of skin: Secondary | ICD-10-CM | POA: Diagnosis not present

## 2023-12-03 LAB — CBC
HCT: 39 % (ref 36.0–46.0)
Hemoglobin: 13 g/dL (ref 12.0–15.0)
MCH: 31.4 pg (ref 26.0–34.0)
MCHC: 33.3 g/dL (ref 30.0–36.0)
MCV: 94.2 fL (ref 80.0–100.0)
Platelets: 219 10*3/uL (ref 150–400)
RBC: 4.14 MIL/uL (ref 3.87–5.11)
RDW: 12.2 % (ref 11.5–15.5)
WBC: 7.9 10*3/uL (ref 4.0–10.5)
nRBC: 0 % (ref 0.0–0.2)

## 2023-12-03 LAB — BASIC METABOLIC PANEL WITH GFR
Anion gap: 9 (ref 5–15)
BUN: 13 mg/dL (ref 6–20)
CO2: 24 mmol/L (ref 22–32)
Calcium: 9.4 mg/dL (ref 8.9–10.3)
Chloride: 102 mmol/L (ref 98–111)
Creatinine, Ser: 0.65 mg/dL (ref 0.44–1.00)
GFR, Estimated: >60 mL/min (ref >60)
Glucose, Bld: 100 mg/dL — ABNORMAL HIGH (ref 70–99)
Potassium: 3.7 mmol/L (ref 3.5–5.1)
Sodium: 135 mmol/L (ref 135–145)

## 2023-12-03 LAB — TROPONIN I (HIGH SENSITIVITY): Troponin I (High Sensitivity): <2 ng/L (ref <18)

## 2023-12-03 NOTE — ED Provider Triage Note (Signed)
 Emergency Medicine Provider Triage Evaluation Note  [name] , a 43 y.o. female  was evaluated in triage.  Pt complains of feeling weak, feeling of different heart beat, numbness in the left arm and second left finger.  Heaviness in her shoulders.  Denies fever, chills, cough, nasal congestion, sick contacts at home or sick contacts at work.  We used the interpreter for sign language.  Review of Systems  Positive:  Negative:   Physical Exam  BP (!) 141/53   Pulse 77   Temp 98.7 F (37.1 C)   Resp 19   Ht 5' (1.524 m)   Wt 73.9 kg   SpO2 99%   BMI 31.83 kg/m pulse is normal Gen:   Awake, no distress   Resp:  Normal effort  MSK:   Moves extremities without difficulty  Other:    Medical Decision Making  Medically screening exam initiated at 6:57 PM.  Appropriate orders placed.  [name] was informed that the remainder of the evaluation will be completed by another provider, this initial triage assessment does not replace that evaluation, and the importance of remaining in the ED until their evaluation is complete.  Patient who presents today with history of weakness, in upper extremities, numbness in the left upper extremity, tingling in the second left finger, sometimes chest discomfort, feeling of different heart beat.  Ordered checks x-ray, EKG, CT of the neck and head authorized by Dr. Marijane Shoulders, [place], PA-C 12/03/23 1911

## 2023-12-03 NOTE — ED Triage Notes (Signed)
 Pt comes with tightness in arms and neck. Pt states this started last week. Pt states she did feel lightheaded. Pt states she felt odd. Pt was stressed out by client who is hyperactive. Pt states that may have caused the stress.  Pt states sometimes she feels like her heart is irregular. Pt states fatigue. Pt denies any cp but little sob.

## 2023-12-04 DIAGNOSIS — R0789 Other chest pain: Secondary | ICD-10-CM | POA: Diagnosis not present

## 2023-12-04 LAB — HCG, QUANTITATIVE, PREGNANCY: hCG, Beta Chain, Quant, S: 1 m[IU]/mL (ref <5)

## 2023-12-04 LAB — TSH: TSH: 0.669 u[IU]/mL (ref 0.350–4.500)

## 2023-12-04 LAB — TROPONIN I (HIGH SENSITIVITY): Troponin I (High Sensitivity): 3 ng/L (ref <18)

## 2023-12-04 LAB — MAGNESIUM: Magnesium: 2.2 mg/dL (ref 1.7–2.4)

## 2023-12-04 MED ORDER — IBUPROFEN 800 MG PO TABS: 800.0000 mg | ORAL_TABLET | Freq: Three times a day (TID) | ORAL | 0 refills | Status: AC | PRN

## 2023-12-04 MED ORDER — IBUPROFEN 800 MG PO TABS
800.0000 mg | ORAL_TABLET | Freq: Once | ORAL | Status: AC
  Administered 2023-12-04: 800 mg via ORAL
  Filled 2023-12-04: qty 1

## 2023-12-04 MED ORDER — METHOCARBAMOL 500 MG PO TABS
500.0000 mg | ORAL_TABLET | Freq: Three times a day (TID) | ORAL | 0 refills | Status: AC | PRN
Start: 2023-12-04 — End: ?

## 2023-12-04 NOTE — Discharge Instructions (Addendum)
 You may alternate Tylenol  1000 mg every 6 hours as needed for pain, fever and Ibuprofen  800 mg every 6-8 hours as needed for pain, fever.  Please take Ibuprofen  with food.  Do not take more than 4000 mg of Tylenol  (acetaminophen ) in a 24 hour period.   Your cardiac labs today were normal as well as your electrolytes, thyroid  tests.  Chest x-ray was clear.  EKG showed no acute abnormalities.  CT of the head and neck showed no abnormalities.  Please follow-up with your PCP as scheduled on July 3 for further workup.

## 2023-12-04 NOTE — ED Provider Notes (Signed)
 [facility] Provider Note    Event Date/Time   First MD Initiated Contact with Patient 12/03/23 2326     (approximate)   History   Generalized Body Aches and Chest Pain   HPI  [name] is a 43 y.o. female with history of deafness, GERD who presents to the emergency department with multiple complaints.  She reports over the past 2 weeks she has had tightness in her upper back that radiates into her arms.  She also reports occasional numbness in both hands worse with sleeping.  She states the numbness goes into the forearm as well.  Patient reports that she feels like her heartbeat is irregular.  She is not having any chest discomfort but does feel short of breath.  No fevers or cough.  No lower extremity swelling or pain.  No nausea or vomiting.  No other numbness or focal weakness.  States she feels weak all over.  No history of PE, DVT, exogenous estrogen use, recent fractures, surgery, trauma, hospitalization, prolonged travel or other immobilization. No lower extremity swelling or pain. No calf tenderness.  Patient denies any prior neck or back surgeries, recent injury, bowel or bladder incontinence.  PCP appt on July 3  History provided by patient using sign language interpreter.    Past Medical History:  Diagnosis Date   Deafness    Gastroesophageal reflux in pregancy    GERD (gastroesophageal reflux disease)    IUD (intrauterine device) in place 08/11/2015   Normal pregnancy, repeat 01/12/2013   Normal pregnancy, repeat 06/27/2015   PONV (postoperative nausea and vomiting)    S/P cesarean section 01/13/2013    Past Surgical History:  Procedure Laterality Date   CESAREAN SECTION     CESAREAN SECTION N/A 01/13/2013   Procedure: REPEAT CESAREAN SECTION;  Surgeon: Chrystal Crape, MD;  Location: [facility];  Service: Obstetrics;  Laterality: N/A;  Jamie from office called and states pt is deaf and needs interpreter on day of surgery.  [facility] Royal called and  intrepreter scheduled for day of surgery.   CESAREAN SECTION N/A 06/28/2015   Procedure: CESAREAN SECTION;  Surgeon: Margaretmary Shaver, MD;  Location: [facility];  Service: Obstetrics;  Laterality: N/A;   INTRAUTERINE DEVICE (IUD) INSERTION  02/18/2019   WISDOM TOOTH EXTRACTION      MEDICATIONS:  Prior to Admission medications   Medication Sig Start Date End Date Taking? Authorizing Provider  levonorgestrel  (MIRENA ) 20 MCG/24HR IUD 1 each by Intrauterine route once.    [provider]    Physical Exam   Triage Vital Signs: ED Triage Vitals  Encounter Vitals Group     BP 12/03/23 1850 (!) 141/53     Girls Systolic BP Percentile --      Girls Diastolic BP Percentile --      Boys Systolic BP Percentile --      Boys Diastolic BP Percentile --      Pulse Rate 12/03/23 1850 77     Resp 12/03/23 1850 19     Temp 12/03/23 1850 98.7 F (37.1 C)     Temp src --      SpO2 12/03/23 1850 99 %     Weight 12/03/23 1856 163 lb (73.9 kg)     Height 12/03/23 1856 5' (1.524 m)     Head Circumference --      Peak Flow --      Pain Score --      Pain Loc --  Pain Education --      Exclude from Growth Chart --     Most recent vital signs: Vitals:   12/03/23 1850 12/04/23 0004  BP: (!) 141/53 138/63  Pulse: 77 74  Resp: 19 18  Temp: 98.7 F (37.1 C)   SpO2: 99% 100%    CONSTITUTIONAL: Alert, responds appropriately to questions. Well-appearing; well-nourished HEAD: Normocephalic, atraumatic EYES: Conjunctivae clear, pupils appear equal, sclera nonicteric ENT: normal nose; moist mucous membranes NECK: Supple, normal ROM, no midline spinal tenderness or step-off or deformity, tender to palpation over the trapezius muscles bilaterally which are tight without overlying skin changes CARD: RRR; S1 and S2 appreciated RESP: Normal chest excursion without splinting or tachypnea; breath sounds clear and equal bilaterally; no wheezes, no rhonchi, no rales, no hypoxia or respiratory  distress, speaking full sentences ABD/GI: Non-distended; soft, non-tender, no rebound, no guarding, no peritoneal signs BACK: The back appears normal EXT: Normal ROM in all joints; no deformity noted, no edema, no calf tenderness or calf swelling SKIN: Normal color for age and race; warm; no rash on exposed skin NEURO: Moves all extremities equally, normal speech, normal sensation, normal gait, no facial asymmetry PSYCH: The patient's mood and manner are appropriate.   ED Results / Procedures / Treatments   LABS: (all labs ordered are listed, but only abnormal results are displayed) Labs Reviewed  BASIC METABOLIC PANEL WITH GFR - Abnormal; Notable for the following components:      Result Value   Glucose, Bld 100 (*)    All other components within normal limits  CBC  HCG, QUANTITATIVE, PREGNANCY  MAGNESIUM  TSH  TROPONIN I (HIGH SENSITIVITY)  TROPONIN I (HIGH SENSITIVITY)     EKG:  EKG Interpretation Date/Time:  Monday December 03 2023 19:00:58 EDT Ventricular Rate:  65 PR Interval:  138 QRS Duration:  80 QT Interval:  392 QTC Calculation: 407 R Axis:   43  Text Interpretation: Normal sinus rhythm with sinus arrhythmia Normal ECG When compared with ECG of 20-Feb-2019 08:11, PREVIOUS ECG IS PRESENT Confirmed by Verneda Golder [phone]) on 12/04/2023 12:29:47 AM         RADIOLOGY: My personal review and interpretation of imaging: CT head and neck unremarkable.  Chest x-ray clear.  I have personally reviewed all radiology reports.   CT Head Wo Contrast Result Date: 12/03/2023 CLINICAL DATA:  Trauma. EXAM: CT HEAD WITHOUT CONTRAST CT CERVICAL SPINE WITHOUT CONTRAST TECHNIQUE: Multidetector CT imaging of the head and cervical spine was performed following the standard protocol without intravenous contrast. Multiplanar CT image reconstructions of the cervical spine were also generated. RADIATION DOSE REDUCTION: This exam was performed according to the departmental dose-optimization  program which includes automated exposure control, adjustment of the mA and/or kV according to patient size and/or use of iterative reconstruction technique. COMPARISON:  None Available. FINDINGS: CT HEAD FINDINGS Brain: No evidence of acute infarction, hemorrhage, hydrocephalus, extra-axial collection or mass lesion/mass effect. Vascular: No hyperdense vessel or unexpected calcification. Skull: Normal. Negative for fracture or focal lesion. Sinuses/Orbits: No acute finding. CT CERVICAL SPINE FINDINGS Alignment: Reversed cervical lordosis without evidence of posterior element distraction or anterior translation consistent with muscle spasm or positioning. Skull base and vertebrae: No acute fracture. No primary bone lesion or focal pathologic process. Soft tissues and spinal canal: No prevertebral fluid or swelling. No visible canal hematoma. Disc levels:  Maintained Upper chest: Negative. IMPRESSION: 1. No acute intracranial abnormality. 2. No acute osseous abnormality of the cervical spine. Electronically Signed  By: Sydell Eva M.D.   On: 12/03/2023 19:39   CT Cervical Spine Wo Contrast Result Date: 12/03/2023 CLINICAL DATA:  Trauma. EXAM: CT HEAD WITHOUT CONTRAST CT CERVICAL SPINE WITHOUT CONTRAST TECHNIQUE: Multidetector CT imaging of the head and cervical spine was performed following the standard protocol without intravenous contrast. Multiplanar CT image reconstructions of the cervical spine were also generated. RADIATION DOSE REDUCTION: This exam was performed according to the departmental dose-optimization program which includes automated exposure control, adjustment of the mA and/or kV according to patient size and/or use of iterative reconstruction technique. COMPARISON:  None Available. FINDINGS: CT HEAD FINDINGS Brain: No evidence of acute infarction, hemorrhage, hydrocephalus, extra-axial collection or mass lesion/mass effect. Vascular: No hyperdense vessel or unexpected calcification. Skull:  Normal. Negative for fracture or focal lesion. Sinuses/Orbits: No acute finding. CT CERVICAL SPINE FINDINGS Alignment: Reversed cervical lordosis without evidence of posterior element distraction or anterior translation consistent with muscle spasm or positioning. Skull base and vertebrae: No acute fracture. No primary bone lesion or focal pathologic process. Soft tissues and spinal canal: No prevertebral fluid or swelling. No visible canal hematoma. Disc levels:  Maintained Upper chest: Negative. IMPRESSION: 1. No acute intracranial abnormality. 2. No acute osseous abnormality of the cervical spine. Electronically Signed   By: Sydell Eva M.D.   On: 12/03/2023 19:39   DG Chest 2 View Result Date: 12/03/2023 CLINICAL DATA:  Short of breath, tightness in arms and neck EXAM: CHEST - 2 VIEW COMPARISON:  08/28/2012 FINDINGS: The heart size and mediastinal contours are within normal limits. Both lungs are clear. The visualized skeletal structures are unremarkable. IMPRESSION: No active cardiopulmonary disease. Electronically Signed   By: Bobbye Burrow M.D.   On: 12/03/2023 19:39     PROCEDURES:  Critical Care performed: No     Procedures    IMPRESSION / MDM / ASSESSMENT AND PLAN / ED COURSE  I reviewed the triage vital signs and the nursing notes.    Patient here with complaints of upper back discomfort, numbness in both arms, generalized weakness, palpitations.    DIFFERENTIAL DIAGNOSIS (includes but not limited to):   ACS, anemia, electrolyte derangement, thyroid  dysfunction, trapezius muscle spasm/strain, doubt cervical myelopathy, critical spinal stenosis, epidural abscess or hematoma, discitis or osteomyelitis, fracture of the cervical spine.  Low suspicion for PE, dissection.  Patient's presentation is most consistent with acute presentation with potential threat to life or bodily function.   PLAN: EKG is nonischemic.  Labs show normal hemoglobin, electrolytes.  Troponin x 2  negative.  Chest x-ray reviewed and interpreted by myself the radiologist and is clear.  CT of the head and neck were also obtained from triage and reviewed/interpreted by myself and the radiologist and show no acute abnormality.   Will obtain TSH, magnesium level.  Will give ibuprofen  here.    MEDICATIONS GIVEN IN ED: Medications  ibuprofen  (ADVIL ) tablet 800 mg (800 mg Oral Given 12/04/23 0050)     ED COURSE: TSH, magnesium normal.  Patient has been neurologically intact here.  No indication for further emergent workup.  Recommended close follow-up with her primary care doctor as she may benefit from an MRI of her cervical spine as an outpatient.  Symptoms also could be due to carpal tunnel or other peripheral nerve compression with sleeping.  Will discharge with anti-inflammatories, muscle relaxers.  Discussed return precautions.   At this time, I do not feel there is any life-threatening condition present. I reviewed all nursing notes, vitals, pertinent previous records.  All lab and urine results, EKGs, imaging ordered have been independently reviewed and interpreted by myself.  I reviewed all available radiology reports from any imaging ordered this visit.  Based on my assessment, I feel the patient is safe to be discharged home without further emergent workup and can continue workup as an outpatient as needed. Discussed all findings, treatment plan as well as usual and customary return precautions.  They verbalize understanding and are comfortable with this plan.  Outpatient follow-up has been provided as needed.  All questions have been answered.    CONSULTS:  none   OUTSIDE RECORDS REVIEWED: Reviewed last family medicine note in October 2023.       FINAL CLINICAL IMPRESSION(S) / ED DIAGNOSES   Final diagnoses:  Atypical chest pain  Neck pain  Bilateral arm numbness and tingling while sleeping     Rx / DC Orders   ED Discharge Orders          Ordered     methocarbamol (ROBAXIN) 500 MG tablet  Every 8 hours PRN        12/04/23 0121    ibuprofen  (ADVIL ) 800 MG tablet  Every 8 hours PRN        12/04/23 0121             Note:  This document was prepared using Dragon voice recognition software and may include unintentional dictation errors.   Manreet Kiernan, Clover Dao, DO 12/04/23 [phone]

## 2023-12-19 ENCOUNTER — Other Ambulatory Visit: Payer: Self-pay

## 2023-12-19 ENCOUNTER — Emergency Department
Admission: EM | Admit: 2023-12-19 | Discharge: 2023-12-19 | Disposition: A | Discharge status: Home / Self Care | Attending: Emergency Medicine | Admitting: Emergency Medicine

## 2023-12-19 ENCOUNTER — Encounter: Payer: Self-pay | Admitting: Emergency Medicine

## 2023-12-19 DIAGNOSIS — F419 Anxiety disorder, unspecified: Secondary | ICD-10-CM | POA: Insufficient documentation

## 2023-12-19 DIAGNOSIS — R600 Localized edema: Secondary | ICD-10-CM | POA: Insufficient documentation

## 2023-12-19 DIAGNOSIS — R2243 Localized swelling, mass and lump, lower limb, bilateral: Secondary | ICD-10-CM | POA: Diagnosis present

## 2023-12-19 LAB — CBC WITH DIFFERENTIAL/PLATELET
Abs Immature Granulocytes: 0.02 10*3/uL (ref 0.00–0.07)
Basophils Absolute: 0 10*3/uL (ref 0.0–0.1)
Basophils Relative: 0 %
Eosinophils Absolute: 0 10*3/uL (ref 0.0–0.5)
Eosinophils Relative: 0 %
HCT: 39.6 % (ref 36.0–46.0)
Hemoglobin: 13 g/dL (ref 12.0–15.0)
Immature Granulocytes: 0 %
Lymphocytes Relative: 21 %
Lymphs Abs: 1.6 10*3/uL (ref 0.7–4.0)
MCH: 31.4 pg (ref 26.0–34.0)
MCHC: 32.8 g/dL (ref 30.0–36.0)
MCV: 95.7 fL (ref 80.0–100.0)
Monocytes Absolute: 0.5 10*3/uL (ref 0.1–1.0)
Monocytes Relative: 7 %
Neutro Abs: 5.4 10*3/uL (ref 1.7–7.7)
Neutrophils Relative %: 72 %
Platelets: 229 10*3/uL (ref 150–400)
RBC: 4.14 MIL/uL (ref 3.87–5.11)
RDW: 12.4 % (ref 11.5–15.5)
WBC: 7.7 10*3/uL (ref 4.0–10.5)
nRBC: 0 % (ref 0.0–0.2)

## 2023-12-19 LAB — BASIC METABOLIC PANEL WITH GFR
Anion gap: 8 (ref 5–15)
BUN: 9 mg/dL (ref 6–20)
CO2: 26 mmol/L (ref 22–32)
Calcium: 9.1 mg/dL (ref 8.9–10.3)
Chloride: 103 mmol/L (ref 98–111)
Creatinine, Ser: 0.73 mg/dL (ref 0.44–1.00)
GFR, Estimated: >60 mL/min (ref >60)
Glucose, Bld: 106 mg/dL — ABNORMAL HIGH (ref 70–99)
Potassium: 3.5 mmol/L (ref 3.5–5.1)
Sodium: 137 mmol/L (ref 135–145)

## 2023-12-19 MED ORDER — HYDROXYZINE HCL 10 MG PO TABS
10.0000 mg | ORAL_TABLET | Freq: Three times a day (TID) | ORAL | 0 refills | Status: AC | PRN
Start: 2023-12-19 — End: ?

## 2023-12-19 NOTE — ED Provider Notes (Signed)
 [facility] Provider Note    Event Date/Time   First MD Initiated Contact with Patient 12/19/23 1757     (approximate)   History   Chief Complaint: Edema  Encounter completed with assistance from ASL video-interpreter Christina HPI  [name] is a 43 y.o. female with a history of deafness, anxiety who comes ED complaining of swelling in bilateral ankles for the last 2 days.  No injury or recent illness, no chest pain or shortness of breath.  No leg pain.  Denies orthopnea dyspnea on exertion.  Also complains of lightheadedness which happens when at rest, thinking about stressful things, gets better when she gets up and walks around.  Eating okay drinking fluids.  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  Prior records reviewed noting that patient was treated for similar symptoms related to stress and anxiety with Lexapro  and hydroxyzine .  She has since reported an intolerance to the Lexapro .  She does believe that the anxiety medication helped her in the past.        Past Medical History:  Diagnosis Date   Deafness    Gastroesophageal reflux in pregancy    GERD (gastroesophageal reflux disease)    IUD (intrauterine device) in place 08/11/2015   Normal pregnancy, repeat 01/12/2013   Normal pregnancy, repeat 06/27/2015   PONV (postoperative nausea and vomiting)    S/P cesarean section 01/13/2013    Current Outpatient Rx   Order #: [id number] Class: Normal   Order #: [id number] Class: Print   Order #: [id number] Class: Historical Med   Order #: [id number] Class: Print    Past Surgical History:  Procedure Laterality Date   CESAREAN SECTION     CESAREAN SECTION N/A 01/13/2013   Procedure: REPEAT CESAREAN SECTION;  Surgeon: Ezzie Marshall, MD;  Location: [facility];  Service: Obstetrics;  Laterality: N/A;  Jamie from office called and states pt is deaf and needs interpreter  on day of surgery.  [facility] Royal called and intrepreter scheduled for day of surgery.   CESAREAN SECTION N/A 06/28/2015   Procedure: CESAREAN SECTION;  Surgeon: Ezzie Buba, MD;  Location: [facility];  Service: Obstetrics;  Laterality: N/A;   INTRAUTERINE DEVICE (IUD) INSERTION  02/18/2019   WISDOM TOOTH EXTRACTION      Physical Exam   Triage Vital Signs: ED Triage Vitals  Encounter Vitals Group     BP 12/19/23 1728 (!) 134/113     Girls Systolic BP Percentile --      Girls Diastolic BP Percentile --      Boys Systolic BP Percentile --      Boys Diastolic BP Percentile --      Pulse Rate 12/19/23 1728 90     Resp 12/19/23 1728 16     Temp 12/19/23 1728 98.5 F (36.9 C)     Temp Source 12/19/23 1728 Oral     SpO2 12/19/23 1728 98 %     Weight 12/19/23 1729 173 lb (78.5 kg)     Height 12/19/23 1729 5' (1.524 m)     Head Circumference --      Peak Flow --      Pain Score 12/19/23 1729 0     Pain Loc --      Pain Education --      Exclude from Growth Chart --     Most recent vital signs: Vitals:  12/19/23 1728  BP: (!) 134/113  Pulse: 90  Resp: 16  Temp: 98.5 F (36.9 C)  SpO2: 98%    General: Awake, no distress.  CV:  Good peripheral perfusion.  Normal distal pulses Resp:  Normal effort.  Abd:  No distention.  Other:  Mild pedal edema on the dorsal foot and malleoli bilaterally, symmetric.  No calf tenderness, symmetric calf circumference.   ED Results / Procedures / Treatments   Labs (all labs ordered are listed, but only abnormal results are displayed) Labs Reviewed  BASIC METABOLIC PANEL WITH GFR - Abnormal; Notable for the following components:      Result Value   Glucose, Bld 106 (*)    All other components within normal limits  CBC WITH DIFFERENTIAL/PLATELET     EKG    RADIOLOGY    PROCEDURES:  Procedures   MEDICATIONS ORDERED IN ED: Medications - No data to display   IMPRESSION / MDM / ASSESSMENT AND PLAN / ED COURSE  I reviewed  the triage vital signs and the nursing notes.  DDx: Electrolyte derangement, AKI, anemia, dependent edema, anxiety  Patient's presentation is most consistent with acute presentation with potential threat to life or bodily function.  Patient presents with multiple symptoms which appear to be due to dependent edema from prolonged standing and hot environment along with additional symptoms related to anxiety.  Will restart hydroxyzine , counseled on leg elevation, compression stockings.  She has an appointment to follow-up with her doctor in 2 weeks and is stable for outpatient follow-up       FINAL CLINICAL IMPRESSION(S) / ED DIAGNOSES   Final diagnoses:  Anxiety  Peripheral edema     Rx / DC Orders   ED Discharge Orders          Ordered    hydrOXYzine  (ATARAX ) 10 MG tablet  3 times daily PRN        12/19/23 1850             Note:  This document was prepared using Dragon voice recognition software and may include unintentional dictation errors.   Viviann Pastor, MD 12/19/23 [phone]

## 2023-12-19 NOTE — Discharge Instructions (Addendum)
 Please talk to you doctor about your anxiety symptoms. It might be helpful to restart medication a daily medication that helps with anxiety and depression symptoms.  In the meantime, take hydroxyzine  as needed for these symptoms.

## 2023-12-19 NOTE — ED Triage Notes (Signed)
 Pt to ED via POV c/o bilateral ankle swelling for the past 2 days. Pt denies hx/o CHF. When asked if pt has shortness of breath she states that she is unsure. Pt is in NAD.

## 2023-12-25 ENCOUNTER — Other Ambulatory Visit: Payer: Self-pay | Admitting: Nurse Practitioner

## 2023-12-25 DIAGNOSIS — R200 Anesthesia of skin: Secondary | ICD-10-CM

## 2023-12-27 ENCOUNTER — Other Ambulatory Visit: Payer: Self-pay | Admitting: Nurse Practitioner

## 2023-12-27 DIAGNOSIS — R200 Anesthesia of skin: Secondary | ICD-10-CM

## 2023-12-27 DIAGNOSIS — M542 Cervicalgia: Secondary | ICD-10-CM

## 2024-01-09 ENCOUNTER — Ambulatory Visit
Admission: RE | Admit: 2024-01-09 | Discharge: 2024-01-09 | Disposition: A | Discharge status: Home / Self Care | Source: Ambulatory Visit | Attending: Nurse Practitioner | Admitting: Nurse Practitioner

## 2024-01-09 DIAGNOSIS — M542 Cervicalgia: Secondary | ICD-10-CM | POA: Insufficient documentation

## 2024-01-09 DIAGNOSIS — R202 Paresthesia of skin: Secondary | ICD-10-CM | POA: Insufficient documentation

## 2024-01-09 DIAGNOSIS — R200 Anesthesia of skin: Secondary | ICD-10-CM | POA: Insufficient documentation

## 2024-06-24 ENCOUNTER — Other Ambulatory Visit: Payer: Self-pay | Admitting: Nurse Practitioner

## 2024-06-24 DIAGNOSIS — R590 Localized enlarged lymph nodes: Secondary | ICD-10-CM

## 2024-07-11 ENCOUNTER — Ambulatory Visit
Admission: RE | Admit: 2024-07-11 | Discharge: 2024-07-11 | Disposition: A | Source: Ambulatory Visit | Attending: Nurse Practitioner

## 2024-07-11 DIAGNOSIS — R590 Localized enlarged lymph nodes: Secondary | ICD-10-CM
